# Patient Record
Sex: Female | Born: 1955 | Race: White | Hispanic: No | Marital: Married | Smoking: Former smoker
Health system: Southern US, Community
[De-identification: ages and names within clinical notes are randomized; demographics above are authoritative.]

## PROBLEM LIST (undated history)

## (undated) DIAGNOSIS — N179 Acute kidney failure, unspecified: Secondary | ICD-10-CM

## (undated) DIAGNOSIS — G4700 Insomnia, unspecified: Secondary | ICD-10-CM

## (undated) DIAGNOSIS — Z973 Presence of spectacles and contact lenses: Secondary | ICD-10-CM

## (undated) HISTORY — DX: Insomnia, unspecified: G47.00

## (undated) HISTORY — PX: BLADDER REPAIR: SHX76

## (undated) HISTORY — PX: KNEE SURGERY: SHX244

---

## 2007-10-05 HISTORY — PX: COLPOSCOPY: SHX161

## 2008-07-04 HISTORY — PX: CERVICAL BIOPSY  W/ LOOP ELECTRODE EXCISION: SUR135

## 2009-10-04 ENCOUNTER — Ambulatory Visit: Payer: Self-pay | Admitting: Gynecologic Oncology

## 2009-10-21 ENCOUNTER — Ambulatory Visit: Payer: Self-pay | Admitting: Gynecologic Oncology

## 2011-10-05 HISTORY — PX: ABDOMINAL HYSTERECTOMY: SHX81

## 2011-12-02 ENCOUNTER — Ambulatory Visit: Payer: Self-pay | Admitting: Obstetrics and Gynecology

## 2011-12-10 ENCOUNTER — Ambulatory Visit: Payer: Self-pay | Admitting: Obstetrics and Gynecology

## 2011-12-27 ENCOUNTER — Ambulatory Visit: Payer: Self-pay | Admitting: Gastroenterology

## 2011-12-29 LAB — PATHOLOGY REPORT

## 2012-04-17 ENCOUNTER — Ambulatory Visit: Payer: Self-pay | Admitting: Obstetrics and Gynecology

## 2012-04-17 LAB — CBC
HGB: 13.3 g/dL (ref 12.0–16.0)
MCH: 30 pg (ref 26.0–34.0)
MCHC: 33.7 g/dL (ref 32.0–36.0)
Platelet: 203 10*3/uL (ref 150–440)
RBC: 4.45 10*6/uL (ref 3.80–5.20)
WBC: 4 10*3/uL (ref 3.6–11.0)

## 2012-04-18 ENCOUNTER — Inpatient Hospital Stay: Payer: Self-pay | Admitting: Obstetrics and Gynecology

## 2012-04-19 LAB — HEMOGLOBIN: HGB: 11.2 g/dL — ABNORMAL LOW (ref 12.0–16.0)

## 2012-04-20 ENCOUNTER — Inpatient Hospital Stay: Payer: Self-pay | Admitting: Obstetrics and Gynecology

## 2012-04-20 LAB — COMPREHENSIVE METABOLIC PANEL
Albumin: 4 g/dL (ref 3.4–5.0)
Alkaline Phosphatase: 117 U/L (ref 50–136)
BUN: 24 mg/dL — ABNORMAL HIGH (ref 7–18)
Bilirubin,Total: 0.8 mg/dL (ref 0.2–1.0)
Calcium, Total: 9.6 mg/dL (ref 8.5–10.1)
Glucose: 134 mg/dL — ABNORMAL HIGH (ref 65–99)
Osmolality: 274 (ref 275–301)
SGOT(AST): 31 U/L (ref 15–37)
Sodium: 134 mmol/L — ABNORMAL LOW (ref 136–145)
Total Protein: 7.7 g/dL (ref 6.4–8.2)

## 2012-04-20 LAB — URINALYSIS, COMPLETE
Bacteria: NONE SEEN
Glucose,UR: NEGATIVE mg/dL (ref 0–75)
Leukocyte Esterase: NEGATIVE
Nitrite: NEGATIVE
Ph: 5 (ref 4.5–8.0)
Protein: NEGATIVE
RBC,UR: 8 /HPF (ref 0–5)

## 2012-04-20 LAB — BASIC METABOLIC PANEL
Anion Gap: 10 (ref 7–16)
BUN: 26 mg/dL — ABNORMAL HIGH (ref 7–18)
EGFR (African American): 14 — ABNORMAL LOW
EGFR (Non-African Amer.): 12 — ABNORMAL LOW
Glucose: 113 mg/dL — ABNORMAL HIGH (ref 65–99)
Osmolality: 279 (ref 275–301)

## 2012-04-20 LAB — CBC
HCT: 39.5 % (ref 35.0–47.0)
MCH: 29.2 pg (ref 26.0–34.0)
Platelet: 234 10*3/uL (ref 150–440)
RBC: 4.48 10*6/uL (ref 3.80–5.20)
WBC: 15.2 10*3/uL — ABNORMAL HIGH (ref 3.6–11.0)

## 2012-04-20 LAB — PATHOLOGY REPORT

## 2012-04-21 LAB — BASIC METABOLIC PANEL
BUN: 11 mg/dL (ref 7–18)
Calcium, Total: 8.5 mg/dL (ref 8.5–10.1)
Co2: 30 mmol/L (ref 21–32)
EGFR (African American): 53 — ABNORMAL LOW
Glucose: 136 mg/dL — ABNORMAL HIGH (ref 65–99)
Osmolality: 283 (ref 275–301)
Potassium: 4.3 mmol/L (ref 3.5–5.1)
Sodium: 141 mmol/L (ref 136–145)

## 2012-04-21 LAB — CBC WITH DIFFERENTIAL/PLATELET
HCT: 32 % — ABNORMAL LOW (ref 35.0–47.0)
HGB: 10.8 g/dL — ABNORMAL LOW (ref 12.0–16.0)
Lymphocytes: 13 %
MCH: 29.8 pg (ref 26.0–34.0)
MCV: 89 fL (ref 80–100)
RDW: 12.9 % (ref 11.5–14.5)
Segmented Neutrophils: 73 %
WBC: 8.6 10*3/uL (ref 3.6–11.0)

## 2012-04-22 LAB — BASIC METABOLIC PANEL
Anion Gap: 7 (ref 7–16)
Calcium, Total: 9 mg/dL (ref 8.5–10.1)
Chloride: 101 mmol/L (ref 98–107)
Co2: 31 mmol/L (ref 21–32)
EGFR (Non-African Amer.): >60
Glucose: 130 mg/dL — ABNORMAL HIGH (ref 65–99)
Osmolality: 276 (ref 275–301)

## 2012-04-22 LAB — MAGNESIUM: Magnesium: 1.8 mg/dL

## 2012-04-23 ENCOUNTER — Ambulatory Visit: Payer: Self-pay | Admitting: Urology

## 2012-04-23 LAB — BASIC METABOLIC PANEL
Calcium, Total: 8.8 mg/dL (ref 8.5–10.1)
Co2: 31 mmol/L (ref 21–32)
Creatinine: 0.54 mg/dL — ABNORMAL LOW (ref 0.60–1.30)
EGFR (African American): >60
EGFR (Non-African Amer.): >60
Glucose: 106 mg/dL — ABNORMAL HIGH (ref 65–99)
Osmolality: 276 (ref 275–301)
Sodium: 140 mmol/L (ref 136–145)

## 2012-04-23 LAB — MAGNESIUM: Magnesium: 1.9 mg/dL

## 2012-04-26 ENCOUNTER — Inpatient Hospital Stay: Payer: Self-pay | Admitting: Obstetrics and Gynecology

## 2012-04-26 LAB — CBC WITH DIFFERENTIAL/PLATELET
Basophil #: 0.1 10*3/uL (ref 0.0–0.1)
Eosinophil #: 0.4 10*3/uL (ref 0.0–0.7)
Eosinophil %: 4 %
HCT: 38.3 % (ref 35.0–47.0)
HGB: 12.5 g/dL (ref 12.0–16.0)
Lymphocyte #: 1.4 10*3/uL (ref 1.0–3.6)
MCH: 28.9 pg (ref 26.0–34.0)
MCHC: 32.7 g/dL (ref 32.0–36.0)
MCV: 89 fL (ref 80–100)
Monocyte #: 1.3 x10 3/mm — ABNORMAL HIGH (ref 0.2–0.9)
Neutrophil %: 66.6 %
RDW: 12.7 % (ref 11.5–14.5)

## 2012-04-26 LAB — URINALYSIS, COMPLETE
Bilirubin,UR: NEGATIVE
Glucose,UR: NEGATIVE mg/dL (ref 0–75)
Ph: 9 (ref 4.5–8.0)
Protein: 100
RBC,UR: 1970 /HPF (ref 0–5)
Specific Gravity: 1.018 (ref 1.003–1.030)
Squamous Epithelial: NONE SEEN

## 2012-04-26 LAB — COMPREHENSIVE METABOLIC PANEL
Anion Gap: 10 (ref 7–16)
Bilirubin,Total: 0.6 mg/dL (ref 0.2–1.0)
Calcium, Total: 9.3 mg/dL (ref 8.5–10.1)
Chloride: 103 mmol/L (ref 98–107)
Co2: 26 mmol/L (ref 21–32)
Creatinine: 0.92 mg/dL (ref 0.60–1.30)
EGFR (African American): >60
EGFR (Non-African Amer.): >60
Glucose: 117 mg/dL — ABNORMAL HIGH (ref 65–99)
Potassium: 3.5 mmol/L (ref 3.5–5.1)
SGOT(AST): 41 U/L — ABNORMAL HIGH (ref 15–37)
SGPT (ALT): 32 U/L
Sodium: 139 mmol/L (ref 136–145)

## 2015-01-21 NOTE — Op Note (Signed)
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   PROCEDURE PERFORMED: Laparoscopic-assisted vaginal hysterectomy and bilateral salpingectomy.   PRIMARY SURGEON: Stoney Bang. Georgianne Fick, MD   ASSISTANT: Donzetta Matters, MD   ESTIMATED BLOOD LOSS: 100 mL.  OPERATIVE FLUIDS: 900 mL.   COMPLICATIONS: None.   INDICATION FOR PROCEDURE: The patient is a 59 year old female who has been followed for the past five years for persistent abnormal Pap as well as a persistent human papilloma virus infection. The patient was previously treated with a cold knife cone which showed no evident dysplasia; however, her Paps continue to be abnormal, and colposcopies are inadequate secondary to cervical stenosis. After discussion of management options, including repeat excisional procedure with hysterectomy, the patient decides to elect for hysterectomy.   FINDINGS: Normal tubes, ovaries, uterus and cervix. The remainder of intra-abdominal anatomy was grossly normal.   SPECIMENS REMOVED: Uterus and cervix, right and left tubes.   DRAINS: Foley to gravity drainage.   IMPLANTS: None.  CONDITION FOLLOWING THE PROCEDURE: Stable.   PROCEDURE IN DETAIL: The risks, benefits, and alternatives of the procedure were discussed with the patient prior to proceeding to the Operating Room. The patient was taken to the Operating Room and placed under general endotracheal anesthesia and positioned in the dorsal lithotomy position. She was then prepped and draped in the usual sterile fashion. A Foley catheter was placed into the patient's bladder. Next, a sterile speculum was placed. The patient's cervix was visualized and noted to be almost flush with the anterior vaginal wall. The  cervix was grasped with two single-tooth tenacula anteriorly and posteriorly, and the cervical os was dilated using Pratt dilators. Following dilation of the cervix, the uterus was noted to sound to 6 cm. A VCare device was placed. After placement of the VCare device, the single-tooth tenaculum was removed, as was the sterile speculum. Attention was then turned to the patient's abdomen. The umbilicus was infiltrated with lidocaine, and a small 5 mm incision was made at the base of the umbilicus. An XL port was then used to gain entry into the peritoneum under direct visualization. Once intraperitoneal entry had been confirmed, insufflation was begun. Two lateral assistant ports were placed under direct visualization. These were 5 mm ports as well. Following placement of the assistant ports, general inspection of the abdomen and pelvis was conducted noting normal findings. However, on manipulating the uterus, it was noted that the [facility] had entered the left portion of the broad ligament. Given that the [facility] was not in proper position, decision was made to proceed with LAVH. The fallopian tubes were dissected off the mesosalpinx. Using a 5 mm Harmonic scalpel, the round ligaments and utero-ovarian ligaments were then cauterized using bipolar cautery transecting using the Harmonic scalpel. The anterior leaf of the broad ligament was then dissected on the right down to the level of the internal cervical os. A bladder flap was created, and the anterior leaf of the broad ligament on the left was dissected in a similar fashion. The uterine vessels were skeletonized and then cauterized using bipolar energy before being transected using Harmonic scalpel. At this point, given that without the VCare cuff in the proper position, the vaginal fornices could not be clearly visualized, and the decision was made to move below for the remainder of  the procedure. A short weighted speculum was placed. The VCare device had been  removed, and a single-tooth tenaculum was placed on the anterior and posterior lip of the cervix. The cervix was then injected with 1% lidocaine, and  a circumferential incision was made around the base of the cervix using a number 10 scalpel. Following this, entry into the posterior cul-de-sac was accomplished using Metzenbaum Mayo scissors. After confirming intraperitoneal entry posteriorly, the short weighted speculum was changed out for a long weighted speculum. Some difficulty was then encountered in attempting to enter the anterior peritoneum; however, this was eventually successful, and the remainder of the hysterectomy was undertaken by taking serial bites medial to prior pedicles using Heaney clamps suture ligating the pedicles using Heaney stitches of 0 Vicryl. Once the uterus had been freed of all its pedicles, it was removed vaginally. The vaginal cuff was then closed using figure-of-eight stitches of 0 Vicryl. Following closure of the cuff, bimanual examination revealed no defects in the cuff, and attention was once again turned to the abdominal wall. A quick laparoscopy was performed to take a look and visualize the vaginal cuff. This was noted to be hemostatic. There was some bleeding from a small pedicle on the patient's right which was cauterized with bipolar cautery. One gram of Arista powder was applied to the vaginal cuff and pedicles.  The later assistant trochars were then removed under direct visualization, and pneumoperitoneum evacuted.  Each trochar sites were dressed with derma bond.  Sponge needle and instrument counts were correct times two and the patient was taken to the recovery room in stable condition.  ____________________________ Stoney Bang. Georgianne Fick, MD ams:cbb D: 04/18/2012 16:39:56 ET T: 04/18/2012 17:14:33 ET JOB#: [id number]  cc: Stoney Bang. Georgianne Fick, MD, <Dictator> Conan Bowens Madelon Lips MD ELECTRONICALLY SIGNED 04/19/2012 7:34

## 2015-01-21 NOTE — Consult Note (Signed)
Pt doing well this AM.discomfortUOP, Clear.N/V.start clear liquid dietfor help stimulate bowel function  Electronic Signatures: Murrell Redden (MD)  (Signed on 19-Jul-13 07:18)  Authored  Last Updated: 19-Jul-13 07:18 by Murrell Redden (MD)

## 2015-01-21 NOTE — Consult Note (Signed)
Chief Complaint:   Subjective/Chief Complaint Urology POD #2 Pt without new complaints (persistent nausea). No flatus. Good pain control with PCA. Has been ambulating in the room.   VITAL SIGNS/ANCILLARY NOTES: **Vital Signs.:   20-Jul-13 12:05   Vital Signs Type Routine   Temperature Temperature (F) 97.4   Celsius 36.3   Temperature Source Oral   Pulse Pulse 86   Respirations Respirations 20   Systolic BP Systolic BP 992   Diastolic BP (mmHg) Diastolic BP (mmHg) 88   Mean BP 107   Pulse Ox % Pulse Ox % 93   Pulse Ox Activity Level  At rest   Oxygen Delivery Room Air/ 21 %  *Intake and Output.:   Shift 20-Jul-13 07:00   Grand Totals Intake:  960 Output:  1800    Net:  -840 75 Hr.:  -2526.46   IV (Primary)      In:  960   Urine ml     Out:  1800   Length of Stay Totals Intake:  3693.54 Output:  7490    Net:  -3796.46    Daily 07:00   Grand Totals Intake:  2023.54 Output:  4268    Net:  -2526.46 24 Hr.:  -2526.46   Oral Intake      In:  0   IV (Primary)      In:  2023.54   Urine ml     Out:  4550   Length of Stay Totals Intake:  3693.54 Output:  7490    Net:  -3796.46    07:45   Grand Totals Intake:  120 Output:      Net:  120 24 Hr.:  120   Oral Intake      In:  120    08:20   Grand Totals Intake:  100.11 Output:  450    Net:  -349.89 24 Hr.:  -229.89   IV (Primary)      In:  100.11   Urine ml     Out:  450   Urinary Method  Foley    Shift 15:00   Grand Totals Intake:  220.11 Output:  450    Net:  -229.89 24 Hr.:  -229.89   Oral Intake      In:  120   IV (Primary)      In:  100.11   Urine ml     Out:  450   Length of Stay Totals Intake:  3913.65 Output:  7940    Net:  -4026.35   Brief Assessment:   Additional Physical Exam WDWN WF in NAD, appearing tired  Abd - Soft/flat, Wound - Dsg CDI Foley in place draining clear urine   Lab Results: Routine Chem:  19-Jul-13 06:13    Glucose, Serum  136   BUN 11   Creatinine (comp)  1.32    Sodium, Serum 141   Potassium, Serum 4.3   Chloride, Serum 104   CO2, Serum 30   Calcium (Total), Serum 8.5   Anion Gap 7   Osmolality (calc) 283   eGFR (African American)  53   eGFR (Non-African American)  45 (eGFR values <76m/min/1.73 m2 may be an indication of chronic kidney disease (CKD). Calculated eGFR is useful in patients with stable renal function. The eGFR calculation will not be reliable in acutely ill patients when serum creatinine is changing rapidly. It is not useful in  patients on dialysis. The eGFR calculation may not be applicable to patients at the low and  high extremes of body sizes, pregnant women, and vegetarians.)   Magnesium, Serum 1.8 (1.8-2.4 THERAPEUTIC RANGE: 4-7 mg/dL TOXIC: > 10 mg/dL  -----------------------)  20-Jul-13 06:45    Glucose, Serum  130   BUN  3   Creatinine (comp)  0.58   Sodium, Serum 139   Potassium, Serum 3.7   Chloride, Serum 101   CO2, Serum 31   Calcium (Total), Serum 9.0   Anion Gap 7   Osmolality (calc) 276   eGFR (African American) >60   eGFR (Non-African American) >60 (eGFR values <30m/min/1.73 m2 may be an indication of chronic kidney disease (CKD). Calculated eGFR is useful in patients with stable renal function. The eGFR calculation will not be reliable in acutely ill patients when serum creatinine is changing rapidly. It is not useful in  patients on dialysis. The eGFR calculation may not be applicable to patients at the low and high extremes of body sizes, pregnant women, and vegetarians.)   Magnesium, Serum 1.8 (1.8-2.4 THERAPEUTIC RANGE: 4-7 mg/dL TOXIC: > 10 mg/dL  -----------------------)  Routine Hem:  19-Jul-13 06:13    WBC (CBC) 8.6   RBC (CBC)  3.61   Hemoglobin (CBC)  10.8   Hematocrit (CBC)  32.0   Platelet Count (CBC) 189 (Result(s) reported on 21 Apr 2012 at 07:44AM.)   MCV 89   MCH 29.8   MCHC 33.6   RDW 12.9   Bands 1   Segmented Neutrophils 73   Lymphocytes 13   Monocytes 12    Eosinophil 1   Diff Comment 1 HYPOCHROMIA   Diff Comment 2 NORMAL PLT MORPHOLGY  Result(s) reported on 21 Apr 2012 at 07:44AM.   Manual Diff MANUAL DIFF DONE  Result(s) reported on 21 Apr 2012 at 07:46AM.   Assessment/Plan:  Invasive Device Daily Assessment of Necessity:   Does the patient currently have any of the following indwelling devices? foley    Indwelling Urinary Catheter continued, requirement due to    Reason to continue Indwelling Urinary Catheter long term catheterization has already been initiated   Assessment/Plan:   Assessment s/p Transvesical Repair of Cystotomy -continues stable post-op -awaiting return of bowel function    Plan 1. Increase ambulation 2. Continue foley catheter with appropriate antibiotic coverage  Will continue to follow with you.   Electronic Signatures: KDarcella Cheshire(MD)  (Signed 20-Jul-13 12:54)  Authored: Chief Complaint, VITAL SIGNS/ANCILLARY NOTES, Brief Assessment, Lab Results, Assessment/Plan   Last Updated: 20-Jul-13 12:54 by KDarcella Cheshire(MD)

## 2015-01-21 NOTE — Consult Note (Signed)
Pt doing wellGood UOP, ClearC,D,IPO'sBMAbd distention@7  days for foley and Staple removal  Electronic Signatures: Murrell Redden (MD)  (Signed on 22-Jul-13 07:57)  Authored  Last Updated: 22-Jul-13 07:57 by Murrell Redden (MD)

## 2015-01-21 NOTE — Consult Note (Signed)
Chief Complaint:   Subjective/Chief Complaint Urology POD #3 Pt without new complaints. Ambulating well, however, still no flatus. No N/V.  Tolerating clear liquids well, however, would like to try a regular diet.   VITAL SIGNS/ANCILLARY NOTES: **Vital Signs.:   21-Jul-13 13:00   Vital Signs Type Routine   Temperature Temperature (F) 97.9   Celsius 36.6   Temperature Source Oral   Pulse Pulse 92   Systolic BP Systolic BP 213   Diastolic BP (mmHg) Diastolic BP (mmHg) 91   Mean BP 110   Pulse Ox % Pulse Ox % 95   Oxygen Delivery Room Air/ 21 %  *Intake and Output.:   Daily 21-Jul-13 07:00   Grand Totals Intake:  1995.22 Output:  2500    Net:  -504.78 24 Hr.:  -504.78   Oral Intake      In:  480   IV (Primary)      In:  1515.22   Urine ml     Out:  2500   Length of Stay Totals Intake:  5688.76 Output:  9990    Net:  -4301.24    Shift 15:00   Grand Totals Intake:  730.32 Output:  1450    Net:  -719.68 24 Hr.:  -719.68   Oral Intake      In:  180   IV (Primary)      In:  550.32   Urine ml     Out:  1450   Length of Stay Totals Intake:  6419.08 Output:  11440    Net:  -5020.92   Brief Assessment:   Additional Physical Exam WDWN WF in NAD  Abd - Soft/flat, Wound - CDI Foley in place draining clear urine   Lab Results: Routine Chem:  21-Jul-13 07:16    Glucose, Serum  106   BUN  3   Creatinine (comp)  0.54   Sodium, Serum 140   Potassium, Serum 3.5   Chloride, Serum 100   CO2, Serum 31   Calcium (Total), Serum 8.8   Anion Gap 9   Osmolality (calc) 276   eGFR (African American) >60   eGFR (Non-African American) >60 (eGFR values <74m/min/1.73 m2 may be an indication of chronic kidney disease (CKD). Calculated eGFR is useful in patients with stable renal function. The eGFR calculation will not be reliable in acutely ill patients when serum creatinine is changing rapidly. It is not useful in  patients on dialysis. The eGFR calculation may not be  applicable to patients at the low and high extremes of body sizes, pregnant women, and vegetarians.)   Magnesium, Serum 1.9 (1.8-2.4 THERAPEUTIC RANGE: 4-7 mg/dL TOXIC: > 10 mg/dL  -----------------------)   Assessment/Plan:  Invasive Device Daily Assessment of Necessity:   Does the patient currently have any of the following indwelling devices? foley    Indwelling Urinary Catheter continued, requirement due to    Reason to continue Indwelling Urinary Catheter long term catheterization has already been initiated   Assessment/Plan:   Assessment s/p Transvesical Repair of Cystotomy -continues stable post-op; ambulating well -still awaiting full return of bowel function, however, pt is developing an appetite and would like to try a regular diet.  -advised pt to monitor closely for bloating or nausea on the regular diet    Plan 1. Continue ambulation 2. Continue foley catheter with appropriate antibiotic coverage 3. Regular diet, as tolerated  Dr. CJacqlyn Larsenwill resume care tomorrow morning.   Electronic Signatures: KDarcella Cheshire(MD)  (Signed 21-Jul-13 13:58)  Authored: Chief Complaint, VITAL SIGNS/ANCILLARY NOTES, Brief Assessment, Lab Results, Assessment/Plan   Last Updated: 21-Jul-13 13:58 by Darcella Cheshire (MD)

## 2015-01-21 NOTE — Op Note (Signed)
PATIENT NAME:  Aimee Stuart, Aimee Stuart MR#:  [id number] DATE OF BIRTH:  03/29/1956  DATE OF PROCEDURE:  04/20/2012  PREOPERATIVE DIAGNOSIS: Bladder perforation.   POSTOPERATIVE DIAGNOSIS: Bladder perforation.   PROCEDURES:  1. Cystorrhaphy.  2. Cystoscopy.  3. Bilateral retrograde pyelogram.   SURGEON: Edrick Oh, M.D.   ASSISTANT: [name], M.D.   ANESTHESIA: General endotracheal anesthesia.   INDICATIONS: The patient is a 59 year old white female who underwent recent hysterectomy. She presented to the Emergency Room on 04/20/2012 with abdominal pain, distention, nausea, and vomiting. An ultrasound was obtained demonstrating significant intra-abdominal fluid. There was concern of the possibility of bladder perforation. She underwent a CT cystogram demonstrating significant contrast within the abdominal compartment consistent with bladder perforation. She presents for further evaluation and bladder repair.   DESCRIPTION OF PROCEDURE: After informed consent was obtained, the patient was taken to the Operating Room and placed in the dorsal lithotomy position under general endotracheal anesthesia. The patient was then prepped and draped in the usual standard fashion. The #22 [place] rigid cystoscope was introduced into the urethra under direct vision with no urethral abnormalities noted. Upon entering the bladder, the mucosa was inspected in its entirety. Complete expansion of the bladder was not possible due to the perforation. The perforation was identified on the posterior bladder base just medial to the left ureteral orifice. The extent of the perforation was approximately 2.5 cm. The ureteral orifices were otherwise normal. No other abnormalities were appreciated. An 8 [place] open-ended catheter was introduced into the right ureteral orifice. A retrograde pyelogram was performed demonstrating no evidence of ureteral injury. Normal contrast caliber and columnization was noted. An attempt at  retrograde through the left ureteral orifice was unsuccessful. A guidewire was placed into the left ureteral orifice and advanced into the upper collecting system under fluoroscopic guidance without difficulty. A #6 [place] open-ended catheter was inserted over the guidewire. A retrograde pyelogram was performed with gradual withdrawal of the open-ended catheter demonstrating no evidence of contrast extravasation with normal caliber and columnization throughout the entire ureter. Rapid drainage was noted after removal of the open-ended catheter. The cystoscope was then removed. A #22 French Foley catheter was placed to gravity drainage. The patient was repositioned into the supine position. She was then reprepped and draped in the usual standard fashion. She had had a previous cesarean section. The decision was made to proceed through the cesarean section scar. The incision was continued approximately 2 cm more lateral in both directions. Significant scar was encountered during the incision. The tissue was noted to be very edematous with large amounts of fluid escaping making cauterization more difficult. The fascia was subsequently identified. Normal planes were identified both above and below the scar. The overlying tissue was dissected free utilizing electrocautery to the level of the scar. The midline was identified. The overlying skin was freed from the fascia, approximately 10 cm superior and 54 cm inferior. The midline was opened. The peritoneum was entered with a large amount of fluid drained. The remainder of the peritoneum was opened. The superior vesicle space was developed. The bladder was identified. The Bookwalter retractor was then placed. The anterior aspect of the bladder was visualized. Allis clamps were placed. The bladder was then opened in standard fashion. The bladder was opened to the dome of the bladder. It was then opened approaching the symphysis. Several areas of bleeding were identified.  These were controlled with electrocautery. Visualization of the posterior aspect of the bladder demonstrated an approximately 2.5 cm perforation  in the bladder base just medial to the left ureteral orifice. There was a reasonable distance, however, between the orifice and the edge of the muscular tear the muscle was noted to be thin on the more left lateral side. The right aspect was full-thickness. This indicated a tangential trauma. The Bookwalter blades were placed in the bladder utilizing sponges to help with visualization utilizing a 2-0 Vicryl suture on a horseshoe needle. The perforation was first closed in a single layer. Allis clamps were utilized to help identify the muscularis. Full thickness bites were obtained through the muscle and initial mucosa. Once the perforation was closed, a second 2-0 Vicryl suture was then utilized with more superficial muscle and mucosal edge reapproximation resulting in a two layer closure. The medial aspect was very close to the ureteral orifice, but was still an adequate distance. Good urine jets were noted bilaterally. The anterior bladder wall was then closed in a two layer fashion utilizing 2-0 Vicryl suture. The midline incision site after removal of the Bookwalter retractor and lap pads was closed. This was closed utilizing 0 and 1 Prolene sutures in a running fashion. Three separate sutures were utilized for the closure. The superior aspect of the incision demonstrated moderately thinned fascia. Wide bites were taken for adequate tissue support. The subcutaneous tissue was tacked to the fascia in four points both superior and inferior. The skin was then closed utilizing staples. A Telfa and Tegaderm dressing was applied. The patient was then awakened from general endotracheal anesthesia. She was taken to the recovery room in stable condition. There were no problems or complications. The patient tolerated the procedure well. Estimated blood loss was minimal.    ____________________________ Denice Bors. Jacqlyn Larsen, MD bsc:ap D: 04/21/2012 17:55:39 ET T: 04/22/2012 09:44:26 ET JOB#: [id number]  cc: Denice Bors. Jacqlyn Larsen, MD, <Dictator> Stoney Bang. Georgianne Fick, MD Denice Bors Amber Guthridge MD ELECTRONICALLY SIGNED 04/25/2012 7:16

## 2015-01-21 NOTE — Consult Note (Signed)
Pt seen, Chart Reviewed, Films Reviewed, Note Dictated  Bladder perforation  Pt willneed emergent cystorraphy to repair the bladder.  We will plan on Cystoscopy with bilateral retrogrades prior to evaluate the ureters and determine the exact location of the perforation.  Discussed with patient and family including risks and benefits. Pt agrees to proceed.  Electronic Signatures: Murrell Redden (MD)  (Signed on 18-Jul-13 17:36)  Authored  Last Updated: 18-Jul-13 17:36 by Murrell Redden (MD)

## 2015-02-11 NOTE — H&P (Signed)
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  She was discharged yesterday from [facility] from the care of Dr. [name] after she had met all discharge goals. She states that yesterday evening at about 6pm she began having nausea and vomiting and from that point was unable to hold anything by mouth.  This includes her pain medication.  The nausea and vomiting persisted throughout the night. So, she contacted Dr. Georgianne Fick this morning who asked her to be evaluated in the emergency room.  She presents with the additional history of voiding only once since arriving home yesterday.  The ED attending physician informed me that she had 250cc in her bladder in the emergency room.  She denies fevers, but has had shaking and chills.  She denies chest pain and trouble breathing.  She has not yet passed flatus postoperatively.    Patient's Medical History No Chronic Illness    Patient's Surgical History Previous C-Section  laparoscopic assisted vaginal hysterectomy, as above.  She has also had knee surgery at age 23.    Medications trazadone 164m qhs for insomnia    Allergies NKDA    Social History none    Family History Non-Contributory   ROS:   ROS All systems were reviewed.  HEENT, CNS, GI, GU, Respiratory, CV, Renal and Musculoskeletal systems were found to be normal., unless noted in HPI   Exam:   Vital Signs stable  afebrile    General moderate distress, she speaks very softly    Mental Status clear    Chest clear  only shallow breaths taken    Heart normal sinus rhythm    Abdomen soft, moderate tenderness to palpation, no rebound or guarding. scant-to-absent bowel sounds. incision sites are all clean/dry/intact    Back no CVAT    Edema no edema    Skin dry    Other  Comprehensive metabolic panel review: Creatinine is 3.7, eFGR is <20.  Glucose is 134, lipase normal, LFTs wnl, anion gap wnl, K+ 4.2,  Radiology: 3-way abdominal x-ray shows findings consistent with recent post-op status with additional findings suggestive of ileus.   CBC WBC=15.2, hgb/hct = 13.1/39.5, platelets=234   Impression:   Impression 1) Acute renal failure secondary to post-operative ileus and secondary nausea and vomiting   Plan:   Comments 1) admit 2) acute renal failure: rehydrate with IV fluids, will give 2L (already received 1 L) over next 4 hours, then give maintenance. recheck creatinine this evening 3) ileus: NPO, discussed possible need for NG tube if nausea/emesis not controlled with IV antiemetics. Will primarly attempt to control with phenergan and give IV zofran if phenergan isn't helping.  Phenergan is primary choice as zofran can lead to slowing of bowels. 4) nausea/emesis: iv antiemetics as above. 5) Post-operative pain: due to NPO status, will give dilaudid 141mq4 hours IV prn pain 6) ultrasound abdomen ordered by ED attending. Will await results of that scan to change any of above management. 7) Dr. StGeorgianne Fickware patient is readmitted and will see her this evening. i will defer to him on overall management, if he prefers.   Electronic Signatures: JaWill BonnetMD)  (Signed 18-Jul-13 12:47)  Authored: L&D Evaluation   Last Updated: 18-Jul-13 12:47 by JaWill BonnetMD)

## 2015-02-11 NOTE — H&P (Signed)
L&D Evaluation:  History:   HPI 59yo female who is  post-op day # 9 s/p laparoscopic assisted vaginal hysterectomy for persistent cervical dysplasia complicated by unrecognized intraoperative cystotomy, now POD#7 s/p cystoscopy, bilateral retrograde pylograms, and cystohrraphy.  The patient presents with 1 day history of po intolerance and LUQ abdominal pain.  She states her po tolerance had been fair although she still did not have much of an appetite.  She reports + flatus, no BM at home.  The patient denies vaginal discharge or bleeding.  No fevers, chills.    Presents with abdominal pain, nausea/vomiting    Patient's Medical History No Chronic Illness    Patient's Surgical History Previous C-Section  laparoscopic assisted vaginal hysterectomy, as above.  Cystorrhaphy. She has also had knee surgery at age 14.    Medications Motrin (Ibuprofen)  Percocet 5/325 1 tab po q4hrs prn pain, Zanax 0.80m po bid prn anxiety    Allergies NKDA    Social History none    Family History Non-Contributory   ROS:   ROS All systems were reviewed.  HEENT, CNS, GI, GU, Respiratory, CV, Renal and Musculoskeletal systems were found to be normal., unless noted in HPI   Exam:   Vital Signs BP >140/90  afebrile    Urine Protein See UA    General no apparent distress    Mental Status clear    Chest clear  only shallow breaths taken    Heart normal sinus rhythm    Abdomen soft, moderate tenderness to palpation, no rebound or guarding. scant-to-absent bowel sounds. incision sites are all clean/dry/intact, intact staple line    Back no CVAT    Edema no edema    Skin dry    Other Labs reviewed Cr normal, WBC non elevated, CT abdomen and pelvis showing no extravesation of contrast from the urinary bladder, moderate amount of stool in colon.   Impression:   Impression Nausea, vomitting, dehyrdation   Plan:   Comments 1) Admit 2) Nausea/Emesis - con't IVF and IV antiemetics, will re-evaluate  response and advance diet as tolerated     - Famotidine for GI ppx 3) Cystorrhaphy - con't foley, con't cipro will switch to IV while not tolerating po 4) Anxiety - prn ativan written   Electronic Signatures: SDorthula Nettles(MD)  (Signed 25-Jul-13 07:48)  Authored: L&D Evaluation   Last Updated: 25-Jul-13 07:48 by SDorthula Nettles(MD)

## 2015-08-04 ENCOUNTER — Other Ambulatory Visit: Payer: Self-pay | Admitting: Physician Assistant

## 2015-08-06 ENCOUNTER — Other Ambulatory Visit: Payer: Self-pay | Admitting: Emergency Medicine

## 2015-08-06 DIAGNOSIS — G4700 Insomnia, unspecified: Secondary | ICD-10-CM

## 2015-08-06 MED ORDER — TRAZODONE HCL 100 MG PO TABS: 100.0000 mg | ORAL_TABLET | Freq: Every day | ORAL | Status: DC

## 2015-08-06 NOTE — Telephone Encounter (Signed)
Received a faxed medication request from [facility] in [place].  The script request says Trazodone 182m 1 daily at bed time,when I enter 1058min the system it automatically says take 2 pills at night.  It won't let me change how many pills the patient should take. Please advise.  Thank you.

## 2015-09-03 ENCOUNTER — Ambulatory Visit: Payer: Self-pay | Admitting: Physician Assistant

## 2015-09-03 VITALS — BP 100/70 | HR 64 | Temp 98.0°F

## 2015-09-03 DIAGNOSIS — Z299 Encounter for prophylactic measures, unspecified: Secondary | ICD-10-CM

## 2015-09-03 DIAGNOSIS — J3089 Other allergic rhinitis: Secondary | ICD-10-CM

## 2015-09-03 MED ORDER — BUPROPION HCL ER (SR) 150 MG PO TB12
ORAL_TABLET | ORAL | Status: DC
Start: 2015-09-03 — End: 2015-12-22

## 2015-09-03 MED ORDER — FLUTICASONE PROPIONATE 50 MCG/ACT NA SUSP: 2.0000 | Freq: Every day | NASAL | Status: DC

## 2015-09-03 NOTE — Progress Notes (Signed)
S: here for yearly labs, no problems, would like to quit smoking, smokes 6 cigarettes qd, husband also smokes and smokes in the house, also out of flonase, ros neg  O: vitals wnl, nad, lungs c t a, cv rrr  A: smoking cessation, allergic rhinitis  P: flonase, wellbutrin sr 154m 1 po qd for 2 weeks then can increase to 1 bid

## 2015-09-04 LAB — CMP12+LP+TP+TSH+6AC+CBC/D/PLT
A/G RATIO: 2.4 (ref 1.1–2.5)
ALK PHOS: 92 IU/L (ref 39–117)
ALT: 19 IU/L (ref 0–32)
AST: 30 IU/L (ref 0–40)
Albumin: 4.5 g/dL (ref 3.5–5.5)
BASOS: 2 %
BUN/Creatinine Ratio: 11 (ref 9–23)
BUN: 10 mg/dL (ref 6–24)
Basophils Absolute: 0.1 10*3/uL (ref 0.0–0.2)
Bilirubin Total: 0.4 mg/dL (ref 0.0–1.2)
CALCIUM: 9.5 mg/dL (ref 8.7–10.2)
CHOL/HDL RATIO: 3.9 ratio (ref 0.0–4.4)
Chloride: 107 mmol/L — ABNORMAL HIGH (ref 97–106)
Cholesterol, Total: 179 mg/dL (ref 100–199)
Creatinine, Ser: 0.88 mg/dL (ref 0.57–1.00)
EOS (ABSOLUTE): 0.4 10*3/uL (ref 0.0–0.4)
Eos: 9 %
Estimated CHD Risk: 0.8 times avg. (ref 0.0–1.0)
Free Thyroxine Index: 2.7 (ref 1.2–4.9)
GFR calc non Af Amer: 72 mL/min/{1.73_m2} (ref >59)
GFR, EST AFRICAN AMERICAN: 83 mL/min/{1.73_m2} (ref >59)
GGT: 19 IU/L (ref 0–60)
GLOBULIN, TOTAL: 1.9 g/dL (ref 1.5–4.5)
Glucose: 89 mg/dL (ref 65–99)
HDL: 46 mg/dL (ref >39)
HEMATOCRIT: 35.5 % (ref 34.0–46.6)
Hemoglobin: 11.6 g/dL (ref 11.1–15.9)
IMMATURE GRANS (ABS): 0 10*3/uL (ref 0.0–0.1)
Immature Granulocytes: 0 %
Iron: 55 ug/dL (ref 27–159)
LDH: 167 IU/L (ref 119–226)
LDL CALC: 105 mg/dL — AB (ref 0–99)
LYMPHS: 32 %
Lymphocytes Absolute: 1.4 10*3/uL (ref 0.7–3.1)
MCH: 29.5 pg (ref 26.6–33.0)
MCHC: 32.7 g/dL (ref 31.5–35.7)
MCV: 90 fL (ref 79–97)
MONOCYTES: 12 %
Monocytes Absolute: 0.5 10*3/uL (ref 0.1–0.9)
NEUTROS ABS: 2 10*3/uL (ref 1.4–7.0)
Neutrophils: 45 %
POTASSIUM: 5.3 mmol/L — AB (ref 3.5–5.2)
Phosphorus: 3.2 mg/dL (ref 2.5–4.5)
Platelets: 273 10*3/uL (ref 150–379)
RBC: 3.93 x10E6/uL (ref 3.77–5.28)
RDW: 13 % (ref 12.3–15.4)
Sodium: 145 mmol/L — ABNORMAL HIGH (ref 136–144)
T3 Uptake Ratio: 28 % (ref 24–39)
T4, Total: 9.6 ug/dL (ref 4.5–12.0)
TRIGLYCERIDES: 142 mg/dL (ref 0–149)
TSH: 1.33 u[IU]/mL (ref 0.450–4.500)
Total Protein: 6.4 g/dL (ref 6.0–8.5)
Uric Acid: 4.4 mg/dL (ref 2.5–7.1)
VLDL Cholesterol Cal: 28 mg/dL (ref 5–40)
WBC: 4.4 10*3/uL (ref 3.4–10.8)

## 2015-09-04 LAB — VITAMIN D 25 HYDROXY (VIT D DEFICIENCY, FRACTURES): Vit D, 25-Hydroxy: 37.9 ng/mL (ref 30.0–100.0)

## 2015-09-04 NOTE — Progress Notes (Signed)
I left a message on patient's voicemail to call me back regarding her lab results.

## 2015-09-09 ENCOUNTER — Other Ambulatory Visit: Payer: Self-pay

## 2015-09-09 DIAGNOSIS — Z299 Encounter for prophylactic measures, unspecified: Secondary | ICD-10-CM

## 2015-09-09 NOTE — Progress Notes (Signed)
I received a call back from the patient.  I went over her lab results with her and she expressed understanding.  Pt is coming back today to recheck her Potassium, Sodium and Chloride levels.

## 2015-09-09 NOTE — Progress Notes (Signed)
Patient came in to have blood drawn per [facility] authorization. Aimee Stuart wants to recheck the patient's Sodium, Potassium and Chloride level. Blood was drawn from left arm without any incident.

## 2015-09-09 NOTE — Progress Notes (Signed)
Attempted to contact patient by phone without any success.  Left a message.

## 2015-09-10 LAB — BASIC METABOLIC PANEL
BUN / CREAT RATIO: 14 (ref 9–23)
BUN: 12 mg/dL (ref 6–24)
CALCIUM: 9.6 mg/dL (ref 8.7–10.2)
CO2: 26 mmol/L (ref 18–29)
Chloride: 103 mmol/L (ref 97–106)
Creatinine, Ser: 0.86 mg/dL (ref 0.57–1.00)
GFR, EST AFRICAN AMERICAN: 86 mL/min/{1.73_m2} (ref >59)
GFR, EST NON AFRICAN AMERICAN: 74 mL/min/{1.73_m2} (ref >59)
Glucose: 88 mg/dL (ref 65–99)
POTASSIUM: 4.6 mmol/L (ref 3.5–5.2)
Sodium: 143 mmol/L (ref 136–144)

## 2015-09-10 NOTE — Progress Notes (Signed)
Spoke with the patient about her lab results and she expressed understanding.

## 2015-11-24 ENCOUNTER — Other Ambulatory Visit: Payer: Self-pay | Admitting: Physician Assistant

## 2015-11-24 NOTE — Telephone Encounter (Signed)
Med refill approved

## 2015-12-22 ENCOUNTER — Other Ambulatory Visit: Payer: Self-pay | Admitting: Physician Assistant

## 2015-12-22 NOTE — Telephone Encounter (Signed)
Med refill approved

## 2015-12-24 ENCOUNTER — Other Ambulatory Visit: Payer: Self-pay | Admitting: Physician Assistant

## 2015-12-24 DIAGNOSIS — Z1231 Encounter for screening mammogram for malignant neoplasm of breast: Secondary | ICD-10-CM

## 2015-12-30 ENCOUNTER — Ambulatory Visit
Admission: RE | Admit: 2015-12-30 | Discharge: 2015-12-30 | Disposition: A | Discharge status: Home / Self Care | Payer: Managed Care, Other (non HMO) | Source: Ambulatory Visit | Attending: Physician Assistant | Admitting: Physician Assistant

## 2015-12-30 DIAGNOSIS — Z1231 Encounter for screening mammogram for malignant neoplasm of breast: Secondary | ICD-10-CM | POA: Diagnosis present

## 2016-01-21 ENCOUNTER — Other Ambulatory Visit: Payer: Self-pay | Admitting: Obstetrics and Gynecology

## 2016-01-21 DIAGNOSIS — M810 Age-related osteoporosis without current pathological fracture: Secondary | ICD-10-CM

## 2016-01-29 ENCOUNTER — Ambulatory Visit: Payer: Managed Care, Other (non HMO)

## 2016-03-15 ENCOUNTER — Other Ambulatory Visit: Payer: Self-pay | Admitting: Physician Assistant

## 2016-03-16 NOTE — Telephone Encounter (Signed)
Med refill approved

## 2016-04-12 ENCOUNTER — Other Ambulatory Visit: Payer: Self-pay | Admitting: Physician Assistant

## 2016-07-20 ENCOUNTER — Other Ambulatory Visit: Payer: Self-pay | Admitting: Physician Assistant

## 2016-07-20 NOTE — Telephone Encounter (Signed)
Med refill approved

## 2016-08-18 ENCOUNTER — Telehealth: Payer: Self-pay | Admitting: Physician Assistant

## 2016-08-18 ENCOUNTER — Other Ambulatory Visit: Payer: Self-pay | Admitting: Physician Assistant

## 2016-08-18 MED ORDER — BUPROPION HCL ER (SR) 150 MG PO TB12: ORAL_TABLET | ORAL | 3 refills | Status: DC

## 2016-08-18 NOTE — Progress Notes (Signed)
Pt needs refill on wellbutrin, sent refill to [facility], pt needs labs and clinic visit prior to additional refills

## 2016-11-14 ENCOUNTER — Other Ambulatory Visit: Payer: Self-pay | Admitting: Physician Assistant

## 2016-11-17 ENCOUNTER — Other Ambulatory Visit: Payer: Self-pay | Admitting: Physician Assistant

## 2016-11-23 ENCOUNTER — Other Ambulatory Visit: Payer: Self-pay | Admitting: Physician Assistant

## 2016-11-23 ENCOUNTER — Other Ambulatory Visit: Payer: Self-pay | Admitting: Obstetrics and Gynecology

## 2016-11-23 DIAGNOSIS — Z1231 Encounter for screening mammogram for malignant neoplasm of breast: Secondary | ICD-10-CM

## 2016-11-24 ENCOUNTER — Encounter: Payer: Self-pay | Admitting: Physician Assistant

## 2016-11-24 ENCOUNTER — Ambulatory Visit: Payer: Self-pay | Admitting: Physician Assistant

## 2016-11-24 VITALS — BP 100/79 | HR 76 | Temp 98.7°F | Ht 68.0 in | Wt 152.0 lb

## 2016-11-24 DIAGNOSIS — Z0189 Encounter for other specified special examinations: Principal | ICD-10-CM

## 2016-11-24 DIAGNOSIS — Z008 Encounter for other general examination: Secondary | ICD-10-CM

## 2016-11-24 DIAGNOSIS — Z0000 Encounter for general adult medical examination without abnormal findings: Secondary | ICD-10-CM

## 2016-11-24 MED ORDER — BUPROPION HCL ER (SR) 150 MG PO TB12: 150.0000 mg | ORAL_TABLET | Freq: Every day | ORAL | 3 refills | Status: DC

## 2016-11-24 MED ORDER — TRAZODONE HCL 100 MG PO TABS
ORAL_TABLET | ORAL | 3 refills | Status: DC
Start: 2016-11-24 — End: 2018-01-24

## 2016-11-24 NOTE — Progress Notes (Signed)
S: here for biometric form, will have regular physical with her gyn at [facility] Dr Georgianne Fick; has order for mammogram, needs repeat colonoscopy, had one at 72 andthey had to "cut things out" ; would like for fasting labs to be sent to her and Dr Georgianne Fick; also has rash on her chest, is red, will bleed on and off ; never completely goes away; states she is doing well on wellbutrin for smoking cessation, only taking 1 qd, not 2 bid, trazadone working well for insomnia; would like to get shingles vaccine since she turned 60;  pmhx reviewed, social hx reviewed, fam hx reviewed, remainder ros neg  O: vitals wnl, nad, lungs c t a, cv rrr,   A: biometric, wellness, insomnia  P: refill on meds x 1 year, will refer for colonoscopy and dermatology, shingles vaccine rx given, counseled on injection; will do baby boomer screening with fasting labs today, forward results to pt and DR Georgianne Fick

## 2016-11-25 LAB — HIV ANTIBODY (ROUTINE TESTING W REFLEX): HIV SCREEN 4TH GENERATION: NONREACTIVE

## 2016-11-25 LAB — CMP12+LP+TP+TSH+6AC+CBC/D/PLT
A/G RATIO: 2 (ref 1.2–2.2)
ALK PHOS: 96 IU/L (ref 39–117)
ALT: 14 IU/L (ref 0–32)
AST: 17 IU/L (ref 0–40)
Albumin: 4.5 g/dL (ref 3.6–4.8)
BASOS ABS: 0.1 10*3/uL (ref 0.0–0.2)
BILIRUBIN TOTAL: 0.3 mg/dL (ref 0.0–1.2)
BUN/Creatinine Ratio: 12 (ref 12–28)
BUN: 12 mg/dL (ref 8–27)
Basos: 1 %
CHOLESTEROL TOTAL: 155 mg/dL (ref 100–199)
Calcium: 9.7 mg/dL (ref 8.7–10.3)
Chloride: 103 mmol/L (ref 96–106)
Chol/HDL Ratio: 3.2 (ref 0.0–4.4)
Creatinine, Ser: 0.99 mg/dL (ref 0.57–1.00)
EOS (ABSOLUTE): 0.2 10*3/uL (ref 0.0–0.4)
EOS: 6 %
FREE THYROXINE INDEX: 2.3 (ref 1.2–4.9)
GFR calc Af Amer: 72 (ref >59)
GFR calc non Af Amer: 62 (ref >59)
GGT: 15 IU/L (ref 0–60)
Globulin, Total: 2.2 (ref 1.5–4.5)
Glucose: 85 mg/dL (ref 65–99)
HDL: 49 mg/dL (ref >39)
HEMOGLOBIN: 12.3 g/dL (ref 11.1–15.9)
Hematocrit: 37.6 % (ref 34.0–46.6)
IMMATURE GRANS (ABS): 0 10*3/uL (ref 0.0–0.1)
IMMATURE GRANULOCYTES: 0 %
Iron: 75 ug/dL (ref 27–159)
LDH: 143 IU/L (ref 119–226)
LDL CALC: 76 (ref 0–99)
LYMPHS ABS: 1.3 10*3/uL (ref 0.7–3.1)
LYMPHS: 37 %
MCH: 28.2 pg (ref 26.6–33.0)
MCHC: 32.7 g/dL (ref 31.5–35.7)
MCV: 86 fL (ref 79–97)
Monocytes Absolute: 0.3 10*3/uL (ref 0.1–0.9)
Monocytes: 9 %
NEUTROS PCT: 47 %
Neutrophils Absolute: 1.6 10*3/uL (ref 1.4–7.0)
PLATELETS: 242 10*3/uL (ref 150–379)
Phosphorus: 3.4 mg/dL (ref 2.5–4.5)
Potassium: 5.2 mmol/L (ref 3.5–5.2)
RBC: 4.36 x10E6/uL (ref 3.77–5.28)
RDW: 13.3 % (ref 12.3–15.4)
Sodium: 144 mmol/L (ref 134–144)
T3 Uptake Ratio: 27 % (ref 24–39)
T4, Total: 8.4 ug/dL (ref 4.5–12.0)
TOTAL PROTEIN: 6.7 g/dL (ref 6.0–8.5)
TRIGLYCERIDES: 149 mg/dL (ref 0–149)
TSH: 1.52 u[IU]/mL (ref 0.450–4.500)
Uric Acid: 4.6 mg/dL (ref 2.5–7.1)
VLDL CHOLESTEROL CAL: 30 (ref 5–40)
WBC: 3.5 10*3/uL (ref 3.4–10.8)

## 2016-11-25 LAB — HCV COMMENT:

## 2016-11-25 LAB — HEPATITIS C ANTIBODY (REFLEX)

## 2016-11-25 LAB — VITAMIN D 25 HYDROXY (VIT D DEFICIENCY, FRACTURES): Vit D, 25-Hydroxy: 41.4 ng/mL (ref 30.0–100.0)

## 2016-12-01 ENCOUNTER — Telehealth: Payer: Self-pay

## 2016-12-01 ENCOUNTER — Other Ambulatory Visit: Payer: Self-pay

## 2016-12-01 DIAGNOSIS — Z1211 Encounter for screening for malignant neoplasm of colon: Secondary | ICD-10-CM

## 2016-12-01 NOTE — Telephone Encounter (Signed)
Gastroenterology Pre-Procedure Review  Request Date:  Requesting Physician: Dr.   PATIENT REVIEW QUESTIONS: The patient responded to the following health history questions as indicated:    1. Are you having any GI issues? no 2. Do you have a personal history of Polyps? no 3. Do you have a family history of Colon Cancer or Polyps? no 4. Diabetes Mellitus? no 5. Joint replacements in the past 12 months?no 6. Major health problems in the past 3 months?no 7. Any artificial heart valves, MVP, or defibrillator?no    MEDICATIONS & ALLERGIES:    Patient reports the following regarding taking any anticoagulation/antiplatelet therapy:   Plavix, Coumadin, Eliquis, Xarelto, Lovenox, Pradaxa, Brilinta, or Effient? no Aspirin? no  Patient confirms/reports the following medications:  Current Outpatient Prescriptions  Medication Sig Dispense Refill  . ALPRAZolam (XANAX) 0.5 MG tablet TAKE 1 TABLET BY MOUTH TWICE A DAY AS NEEDED FOR ANXIETY  0  . buPROPion (WELLBUTRIN SR) 150 MG 12 hr tablet Take 1 tablet (150 mg total) by mouth daily. Take 1 po bid, or 2 po qd, Pt needs yearly labs prior to additional refills 90 tablet 3  . fluticasone (FLONASE) 50 MCG/ACT nasal spray Place 2 sprays into both nostrils daily. 16 g 6  . traZODone (DESYREL) 100 MG tablet TAKE 1 TABLET (100 MG TOTAL) BY MOUTH AT BEDTIME. 90 tablet 3   No current facility-administered medications for this visit.     Patient confirms/reports the following allergies:  No Known Allergies  No orders of the defined types were placed in this encounter.   AUTHORIZATION INFORMATION Primary Insurance: 1D#: Group #:  Secondary Insurance: 1D#: Group #:  SCHEDULE INFORMATION: Date: 12/17/16 Location: [place]

## 2016-12-14 ENCOUNTER — Encounter: Payer: Self-pay | Admitting: *Deleted

## 2016-12-17 NOTE — Discharge Instructions (Signed)

## 2016-12-20 ENCOUNTER — Encounter
Admission: RE | Disposition: A | Discharge status: Home / Self Care | Payer: Self-pay | Source: Ambulatory Visit | Attending: Gastroenterology

## 2016-12-20 ENCOUNTER — Ambulatory Visit
Admission: RE | Admit: 2016-12-20 | Discharge: 2016-12-20 | Disposition: A | Discharge status: Home / Self Care | Payer: Managed Care, Other (non HMO) | Source: Ambulatory Visit | Attending: Gastroenterology | Admitting: Gastroenterology

## 2016-12-20 ENCOUNTER — Ambulatory Visit: Payer: Managed Care, Other (non HMO) | Admitting: Anesthesiology

## 2016-12-20 DIAGNOSIS — Z1211 Encounter for screening for malignant neoplasm of colon: Secondary | ICD-10-CM | POA: Diagnosis not present

## 2016-12-20 DIAGNOSIS — Z79899 Other long term (current) drug therapy: Secondary | ICD-10-CM | POA: Insufficient documentation

## 2016-12-20 DIAGNOSIS — Z7951 Long term (current) use of inhaled steroids: Secondary | ICD-10-CM | POA: Insufficient documentation

## 2016-12-20 DIAGNOSIS — Z8719 Personal history of other diseases of the digestive system: Secondary | ICD-10-CM | POA: Diagnosis not present

## 2016-12-20 DIAGNOSIS — G4700 Insomnia, unspecified: Secondary | ICD-10-CM | POA: Insufficient documentation

## 2016-12-20 DIAGNOSIS — Z860100 Personal history of colon polyps, unspecified: Secondary | ICD-10-CM

## 2016-12-20 DIAGNOSIS — Z87891 Personal history of nicotine dependence: Secondary | ICD-10-CM | POA: Diagnosis not present

## 2016-12-20 HISTORY — DX: Presence of spectacles and contact lenses: Z97.3

## 2016-12-20 HISTORY — PX: COLONOSCOPY WITH PROPOFOL: SHX5780

## 2016-12-20 HISTORY — DX: Acute kidney failure, unspecified: N17.9

## 2016-12-20 SURGERY — COLONOSCOPY WITH PROPOFOL
Anesthesia: Monitor Anesthesia Care | Wound class: Contaminated

## 2016-12-20 MED ORDER — PROPOFOL 10 MG/ML IV BOLUS
INTRAVENOUS | Status: DC | PRN
  Administered 2016-12-20 (×6): 20 mg via INTRAVENOUS
  Administered 2016-12-20: 70 mg via INTRAVENOUS
  Administered 2016-12-20: 30 mg via INTRAVENOUS

## 2016-12-20 MED ORDER — ACETAMINOPHEN 325 MG PO TABS: 325.0000 mg | ORAL_TABLET | ORAL | Status: DC | PRN

## 2016-12-20 MED ORDER — ACETAMINOPHEN 160 MG/5ML PO SOLN: 325.0000 mg | ORAL | Status: DC | PRN

## 2016-12-20 MED ORDER — STERILE WATER FOR IRRIGATION IR SOLN
Status: DC | PRN
  Administered 2016-12-20: 10:00:00

## 2016-12-20 MED ORDER — LACTATED RINGERS IV SOLN
INTRAVENOUS | Status: DC
  Administered 2016-12-20: 09:00:00 via INTRAVENOUS

## 2016-12-20 MED ORDER — ONDANSETRON HCL 4 MG/2ML IJ SOLN: 4.0000 mg | Freq: Once | INTRAMUSCULAR | Status: DC | PRN

## 2016-12-20 MED ORDER — LIDOCAINE HCL (CARDIAC) 20 MG/ML IV SOLN
INTRAVENOUS | Status: DC | PRN
  Administered 2016-12-20: 20 mg via INTRAVENOUS

## 2016-12-20 SURGICAL SUPPLY — 23 items
CANISTER SUCT 1200ML W/VALVE (MISCELLANEOUS) ×3 IMPLANT
CLIP HMST 235XBRD CATH ROT (MISCELLANEOUS) IMPLANT
CLIP RESOLUTION 360 11X235 (MISCELLANEOUS)
FCP ESCP3.2XJMB 240X2.8X (MISCELLANEOUS)
FORCEPS BIOP RAD 4 LRG CAP 4 (CUTTING FORCEPS) IMPLANT
FORCEPS BIOP RJ4 240 W/NDL (MISCELLANEOUS)
FORCEPS ESCP3.2XJMB 240X2.8X (MISCELLANEOUS) IMPLANT
GOWN CVR UNV OPN BCK APRN NK (MISCELLANEOUS) ×2 IMPLANT
GOWN ISOL THUMB LOOP REG UNIV (MISCELLANEOUS) ×6
INJECTOR VARIJECT VIN23 (MISCELLANEOUS) IMPLANT
KIT DEFENDO VALVE AND CONN (KITS) IMPLANT
KIT ENDO PROCEDURE OLY (KITS) ×3 IMPLANT
MARKER SPOT ENDO TATTOO 5ML (MISCELLANEOUS) IMPLANT
PAD GROUND ADULT SPLIT (MISCELLANEOUS) IMPLANT
PROBE APC STR FIRE (PROBE) IMPLANT
RETRIEVER NET ROTH 2.5X230 LF (MISCELLANEOUS) IMPLANT
SNARE SHORT THROW 13M SML OVAL (MISCELLANEOUS) IMPLANT
SNARE SHORT THROW 30M LRG OVAL (MISCELLANEOUS) IMPLANT
SNARE SNG USE RND 15MM (INSTRUMENTS) IMPLANT
SPOT EX ENDOSCOPIC TATTOO (MISCELLANEOUS)
TRAP ETRAP POLY (MISCELLANEOUS) IMPLANT
VARIJECT INJECTOR VIN23 (MISCELLANEOUS)
WATER STERILE IRR 250ML POUR (IV SOLUTION) ×3 IMPLANT

## 2016-12-20 NOTE — Anesthesia Preprocedure Evaluation (Signed)
Anesthesia Evaluation  Patient identified by MRN, date of birth, ID band Patient awake    Reviewed: Allergy & Precautions, NPO status , Patient's Chart, lab work & pertinent test results  Airway Mallampati: I  TM Distance: >3 FB Neck ROM: Full    Dental no notable dental hx.    Pulmonary former smoker,    Pulmonary exam normal        Cardiovascular negative cardio ROS Normal cardiovascular exam     Neuro/Psych negative neurological ROS  negative psych ROS   GI/Hepatic negative GI ROS, Neg liver ROS,   Endo/Other  negative endocrine ROS  Renal/GU CRFRenal disease     Musculoskeletal negative musculoskeletal ROS (+)   Abdominal   Peds  Hematology negative hematology ROS (+)   Anesthesia Other Findings   Reproductive/Obstetrics                             Anesthesia Physical Anesthesia Plan  ASA: II  Anesthesia Plan: MAC   Post-op Pain Management:    Induction: Intravenous  Airway Management Planned:   Additional Equipment:   Intra-op Plan:   Post-operative Plan:   Informed Consent: I have reviewed the patients History and Physical, chart, labs and discussed the procedure including the risks, benefits and alternatives for the proposed anesthesia with the patient or authorized representative who has indicated his/her understanding and acceptance.     Plan Discussed with: CRNA  Anesthesia Plan Comments:         Anesthesia Quick Evaluation

## 2016-12-20 NOTE — Transfer of Care (Signed)
Immediate Anesthesia Transfer of Care Note  Patient: Aimee Stuart  Procedure(s) Performed: Procedure(s): COLONOSCOPY WITH PROPOFOL (N/A)  Patient Location: PACU  Anesthesia Type: MAC  Level of Consciousness: awake, alert  and patient cooperative  Airway and Oxygen Therapy: Patient Spontanous Breathing and Patient connected to supplemental oxygen  Post-op Assessment: Post-op Vital signs reviewed, Patient's Cardiovascular Status Stable, Respiratory Function Stable, Patent Airway and No signs of Nausea or vomiting  Post-op Vital Signs: Reviewed and stable  Complications: No apparent anesthesia complications

## 2016-12-20 NOTE — Anesthesia Postprocedure Evaluation (Signed)
Anesthesia Post Note  Patient: Aimee Stuart  Procedure(s) Performed: Procedure(s) (LRB): COLONOSCOPY WITH PROPOFOL (N/A)  Patient location during evaluation: PACU Anesthesia Type: MAC Level of consciousness: awake and alert and oriented Pain management: pain level controlled Vital Signs Assessment: post-procedure vital signs reviewed and stable Respiratory status: spontaneous breathing and nonlabored ventilation Cardiovascular status: stable Postop Assessment: no signs of nausea or vomiting and adequate PO intake Anesthetic complications: no    Estill Batten

## 2016-12-20 NOTE — Anesthesia Procedure Notes (Signed)
Procedure Name: MAC Performed by: Lind Guest Pre-anesthesia Checklist: Patient identified, Emergency Drugs available, Suction available, Patient being monitored and Timeout performed Patient Re-evaluated:Patient Re-evaluated prior to inductionOxygen Delivery Method: Nasal cannula

## 2016-12-20 NOTE — Op Note (Signed)
[facility] Patient Name: Aimee Stuart Procedure Date: 12/20/2016 9:48 AM MRN: [id number] Account #: [id number] Date of Birth: 1955-12-08 Admit Type: Outpatient Age: 61 Room: [facility] OR ROOM 01 Gender: Female Note Status: Finalized Procedure:            Colonoscopy Indications:          High risk colon cancer surveillance: Personal history                        of colonic polyps Providers:            [name] MD, MD Referring MD:         Versie Starks, MD (Referring MD) Medicines:            Propofol per Anesthesia Complications:        No immediate complications. Procedure:            Pre-Anesthesia Assessment:                       - Prior to the procedure, a History and Physical was                        performed, and patient medications and allergies were                        reviewed. The patient's tolerance of previous                        anesthesia was also reviewed. The risks and benefits of                        the procedure and the sedation options and risks were                        discussed with the patient. All questions were                        answered, and informed consent was obtained. Prior                        Anticoagulants: The patient has taken no previous                        anticoagulant or antiplatelet agents. ASA Grade                        Assessment: II - A patient with mild systemic disease.                        After reviewing the risks and benefits, the patient was                        deemed in satisfactory condition to undergo the                        procedure.                       After obtaining informed consent, the colonoscope was  passed under direct vision. Throughout the procedure,                        the patient's blood pressure, pulse, and oxygen                        saturations were monitored continuously. The [place] (S#: [id number]) was introduced through                        the anus and advanced to the the cecum, identified by                        appendiceal orifice and ileocecal valve. The                        colonoscopy was performed without difficulty. The                        patient tolerated the procedure well. The quality of                        the bowel preparation was poor. Findings:      The perianal and digital rectal examinations were normal.      A large amount of stool was found in the entire colon. Impression:           - Preparation of the colon was poor.                       - Stool in the entire examined colon.                       - No specimens collected. Recommendation:       - Discharge patient to home.                       - Resume previous diet.                       - Continue present medications.                       - Repeat colonoscopy in 1 year because the bowel                        preparation was poor. Procedure Code(s):    --- Professional ---                       [phone], Colonoscopy, flexible; diagnostic, including                        collection of specimen(s) by brushing or washing, when                        performed (separate procedure) Diagnosis Code(s):    --- Professional ---                       Z86.010, Personal history of colonic polyps CPT copyright 2016 American Medical Association. All rights reserved. The codes documented in this report are preliminary and upon coder review may  be revised to meet current compliance requirements. [name] MD, MD 12/20/2016 10:15:16 AM This report has been signed electronically. Number of Addenda: 0 Note Initiated On: 12/20/2016 9:48 AM Scope Withdrawal Time: 0 hours 6 minutes 55 seconds  Total Procedure Duration: 0 hours 15 minutes 14 seconds       [facility]

## 2016-12-20 NOTE — H&P (Signed)
  [name], MD [facility] [address], [place], [place] [address] Phone:[phone] Fax : [phone]  Primary Care Physician:  No primary care provider on file. Primary Gastroenterologist:  Dr. Allen Norris  Pre-Procedure History & Physical: HPI:  Aimee Stuart is a 61 y.o. female is here for an colonoscopy.   Past Medical History:  Diagnosis Date  . Acute renal failure (HCC)    due to bladder damage during hysterectomy  . Insomnia   . Wears contact lenses    sometimes    Past Surgical History:  Procedure Laterality Date  . ABDOMINAL HYSTERECTOMY    . BLADDER REPAIR     damaged during hysterectomy  . CESAREAN SECTION    . KNEE SURGERY Right    age 34    Prior to Admission medications   Medication Sig Start Date End Date Taking? Authorizing Provider  ALPRAZolam (XANAX) 0.5 MG tablet TAKE 1 TABLET BY MOUTH TWICE A DAY AS NEEDED FOR ANXIETY 01/22/16  Yes Historical Provider, MD  fluticasone (FLONASE) 50 MCG/ACT nasal spray Place 2 sprays into both nostrils daily. 09/03/15  Yes Versie Starks, PA-C  traZODone (DESYREL) 100 MG tablet TAKE 1 TABLET (100 MG TOTAL) BY MOUTH AT BEDTIME. 11/24/16  Yes Versie Starks, PA-C  buPROPion [facility] SR) 150 MG 12 hr tablet Take 1 tablet (150 mg total) by mouth daily. Take 1 po bid, or 2 po qd, Pt needs yearly labs prior to additional refills 11/24/16   Versie Starks, PA-C    Allergies as of 12/01/2016  . (No Known Allergies)    Family History  Problem Relation Age of Onset  . Alzheimer's disease Mother   . Cancer Father 82    prostate  . Heart disease Father     Social History   Social History  . Marital status: Married    Spouse name: N/A  . Number of children: N/A  . Years of education: N/A   Occupational History  . Not on file.   Social History Main Topics  . Smoking status: Former Smoker    Types: Cigarettes    Quit date: 10/06/2015  . Smokeless tobacco: Never Used  . Alcohol use 0.0 oz/week     Comment: 1-2  drinks/ month  . Drug use: Unknown  . Sexual activity: Not on file   Other Topics Concern  . Not on file   Social History Narrative  . No narrative on file    Review of Systems: See HPI, otherwise negative ROS  Physical Exam: BP 114/81   Pulse 78   Temp 98.1 F (36.7 C) (Tympanic)   Resp 16   Ht 5' 8"  (1.727 m)   Wt 149 lb (67.6 kg)   SpO2 99%   BMI 22.66 kg/m  General:   Alert,  pleasant and cooperative in NAD Head:  Normocephalic and atraumatic. Neck:  Supple; no masses or thyromegaly. Lungs:  Clear throughout to auscultation.    Heart:  Regular rate and rhythm. Abdomen:  Soft, nontender and nondistended. Normal bowel sounds, without guarding, and without rebound.   Neurologic:  Alert and  oriented x4;  grossly normal neurologically.  Impression/Plan: Aimee Stuart is here for an colonoscopy to be performed for history of colon polyps  Risks, benefits, limitations, and alternatives regarding  colonoscopy have been reviewed with the patient.  Questions have been answered.  All parties agreeable.   [name], MD  12/20/2016, 9:17 AM

## 2016-12-21 ENCOUNTER — Encounter: Payer: Self-pay | Admitting: Gastroenterology

## 2017-01-03 ENCOUNTER — Ambulatory Visit
Admission: RE | Admit: 2017-01-03 | Discharge: 2017-01-03 | Disposition: A | Discharge status: Home / Self Care | Payer: Managed Care, Other (non HMO) | Source: Ambulatory Visit | Attending: Obstetrics and Gynecology | Admitting: Obstetrics and Gynecology

## 2017-01-03 DIAGNOSIS — Z1231 Encounter for screening mammogram for malignant neoplasm of breast: Secondary | ICD-10-CM | POA: Diagnosis present

## 2017-01-04 ENCOUNTER — Encounter: Payer: Self-pay | Admitting: Obstetrics and Gynecology

## 2017-01-17 ENCOUNTER — Ambulatory Visit: Payer: Self-pay | Admitting: Obstetrics and Gynecology

## 2017-02-21 ENCOUNTER — Encounter: Payer: Self-pay | Admitting: Obstetrics and Gynecology

## 2017-02-21 ENCOUNTER — Ambulatory Visit (INDEPENDENT_AMBULATORY_CARE_PROVIDER_SITE_OTHER): Payer: Managed Care, Other (non HMO) | Admitting: Obstetrics and Gynecology

## 2017-02-21 VITALS — BP 124/80 | HR 81 | Ht 68.0 in | Wt 152.0 lb

## 2017-02-21 DIAGNOSIS — Z01419 Encounter for gynecological examination (general) (routine) without abnormal findings: Secondary | ICD-10-CM

## 2017-02-21 NOTE — Progress Notes (Signed)
Patient ID: Aimee Stuart, female   DOB: December 20, 1955, 61 y.o.   MRN: [id number]     Gynecology Annual Exam  PCP: Patient, No Pcp Per  Chief Complaint:  Chief Complaint  Patient presents with  . Gynecologic Exam    History of Present Illness:Patient is a 61 y.o. G2P2 presents for annual exam. The patient has no complaints today.   LMP: No LMP recorded. Patient has had a hysterectomy. No postmenopausal bleeding, no significant vasomotor symptoms  The patient is sexually active. She denies dyspareunia.  The patient does not perform self breast exams.  There is no notable family history of breast or ovarian cancer in her family.  The patient wears seatbelts: yes.   The patient has regular exercise: no.    The patient denies current symptoms of depression.     Review of Systems: Review of Systems  Constitutional: Negative for chills and fever.  HENT: Negative for congestion.   Respiratory: Negative for cough and shortness of breath.   Cardiovascular: Negative for chest pain and palpitations.  Gastrointestinal: Negative for abdominal pain, constipation, diarrhea, heartburn, nausea and vomiting.  Genitourinary: Negative for dysuria, frequency and urgency.  Skin: Negative for itching and rash.  Neurological: Negative for dizziness and headaches.  Endo/Heme/Allergies: Negative for polydipsia.  Psychiatric/Behavioral: Negative for depression.    Past Medical History:  Past Medical History:  Diagnosis Date  . Acute renal failure (HCC)    due to bladder damage during hysterectomy  . Insomnia   . Wears contact lenses    sometimes    Past Surgical History:  Past Surgical History:  Procedure Laterality Date  . ABDOMINAL HYSTERECTOMY    . BLADDER REPAIR     damaged during hysterectomy  . CESAREAN SECTION    . COLONOSCOPY WITH PROPOFOL N/A 12/20/2016   Procedure: COLONOSCOPY WITH PROPOFOL;  Surgeon: [name], MD;  Location: [facility];  Service: Endoscopy;   Laterality: N/A;  . KNEE SURGERY Right    age 2    Gynecologic History:  No LMP recorded. Patient has had a hysterectomy. Last mammogram:01/03/17 Results were: BI-RAD I Obstetric History: G2P2  Family History:  Family History  Problem Relation Age of Onset  . Alzheimer's disease Mother   . Cancer Father 25       prostate  . Heart disease Father     Social History:  Social History   Social History  . Marital status: Married    Spouse name: N/A  . Number of children: N/A  . Years of education: N/A   Occupational History  . Not on file.   Social History Main Topics  . Smoking status: Former Smoker    Types: Cigarettes    Quit date: 10/06/2015  . Smokeless tobacco: Never Used  . Alcohol use 0.0 oz/week     Comment: 1-2 drinks/ month  . Drug use: No  . Sexual activity: No   Other Topics Concern  . Not on file   Social History Narrative  . No narrative on file    Allergies:  No Known Allergies  Medications: Prior to Admission medications   Medication Sig Start Date End Date Taking? Authorizing Provider  ALPRAZolam (XANAX) 0.5 MG tablet TAKE 1 TABLET BY MOUTH TWICE A DAY AS NEEDED FOR ANXIETY 01/22/16  Yes [provider]  buPROPion (WELLBUTRIN SR) 150 MG 12 hr tablet Take 1 tablet (150 mg total) by mouth daily. Take 1 po bid, or 2 po qd, Pt needs yearly labs prior  to additional refills 11/24/16  Yes Fisher, Linden Dolin, PA-C  fluticasone [facility]) 50 MCG/ACT nasal spray Place 2 sprays into both nostrils daily. 09/03/15  Yes Versie Starks, PA-C  traZODone (DESYREL) 100 MG tablet TAKE 1 TABLET (100 MG TOTAL) BY MOUTH AT BEDTIME. 11/24/16  Yes Fisher, Linden Dolin, PA-C    Physical Exam Vitals: Blood pressure 124/80, pulse 81, height 5' 8"  (1.727 m), weight 152 lb (68.9 kg).  General: NAD HEENT: normocephalic, anicteric Thyroid: no enlargement, no palpable nodules Pulmonary: No increased work of breathing, CTAB Cardiovascular: RRR, distal pulses 2+ Breast:  Breast symmetrical, no tenderness, no palpable nodules or masses, no skin or nipple retraction present, no nipple discharge.  No axillary or supraclavicular lymphadenopathy. Abdomen: NABS, soft, non-tender, non-distended.  Umbilicus without lesions.  No hepatomegaly, splenomegaly or masses palpable. No evidence of hernia  Genitourinary:  External: Normal external female genitalia.  Normal urethral meatus, normal  Bartholin's and Skene's glands.    Vagina: Normal vaginal mucosa, no evidence of prolapse.    Cervix: Grossly normal in appearance, no bleeding  Uterus: Non-enlarged, mobile, normal contour.  No CMT  Adnexa: ovaries non-enlarged, no adnexal masses  Rectal: deferred  Lymphatic: no evidence of inguinal lymphadenopathy Extremities: no edema, erythema, or tenderness Neurologic: Grossly intact Psychiatric: mood appropriate, affect full  Female chaperone present for pelvic and breast  portions of the physical exam    Assessment: 61 y.o. G2P2 No problem-specific Assessment & Plan notes found for this encounter.   Plan: Problem List Items Addressed This Visit    None    Visit Diagnoses    Women's annual routine gynecological examination    -  Primary      1) Mammogram - recommend yearly screening mammogram.  Mammogram Is up to date  2) STI screening not offered/discussed  3) ASCCP guidelines and rational discussed s/p TLH no further paps indicated no history of CIN II  4) Osteoporosis  - per USPTF routine screening DEXA at age 61  5) Routine healthcare maintenance including cholesterol, diabetes screening discussed managed by PCP  6) Colonoscopy up to date 01/03/17 repeat 1 year because of poor prep with Dr. Allen Norris  7) Follow up 1 year for routine annual

## 2017-02-21 NOTE — Patient Instructions (Signed)

## 2017-07-24 IMAGING — MG MM DIGITAL SCREENING BILAT W/ TOMO W/ CAD
8 of 12 series · 8 of 28 positions shown · non-contrast
Comparison: Previous exam(s).

CLINICAL DATA: Screening.

EXAM:
2D DIGITAL SCREENING BILATERAL MAMMOGRAM WITH CAD AND ADJUNCT TOMO

[L MLO synth-2D]
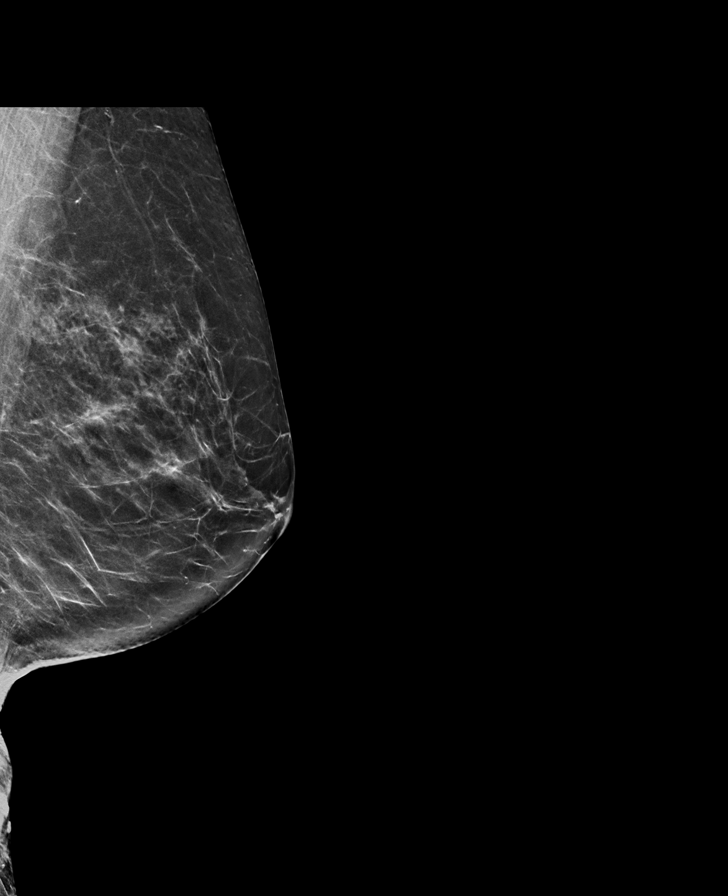

[R CC synth-2D]
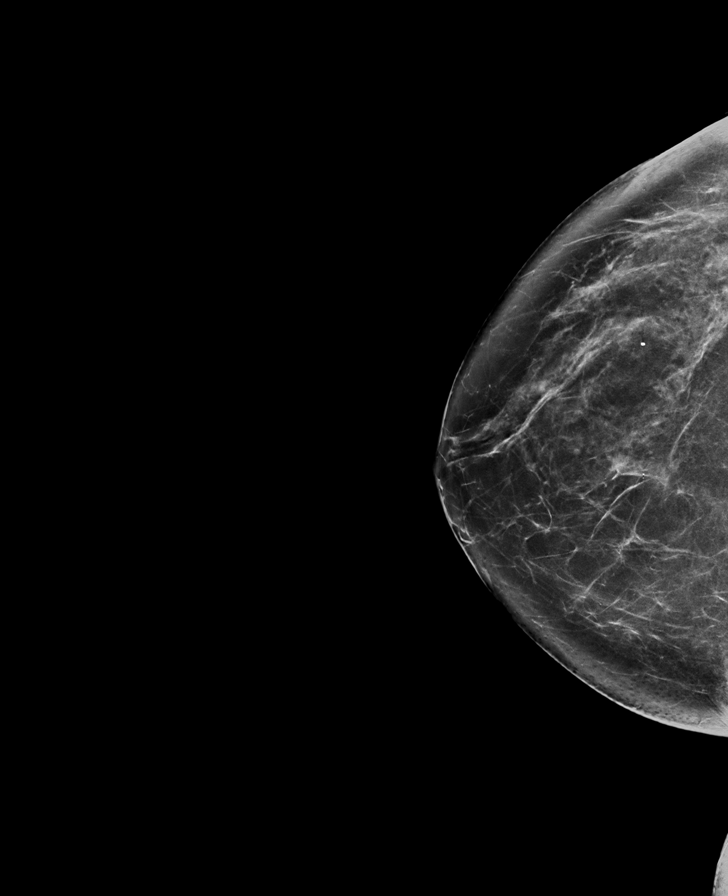

[R MLO synth-2D]
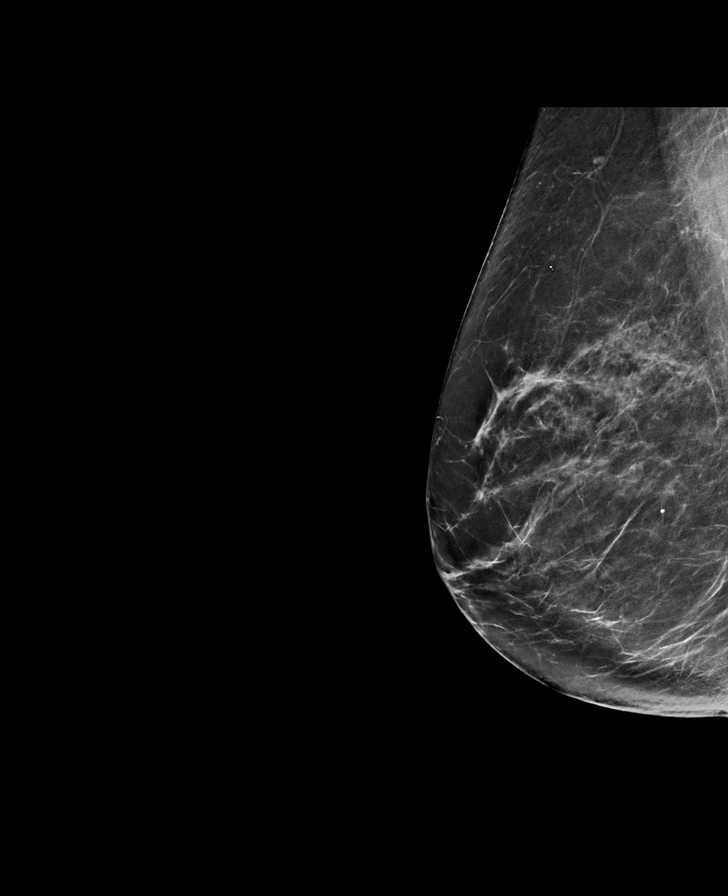

[R CC]
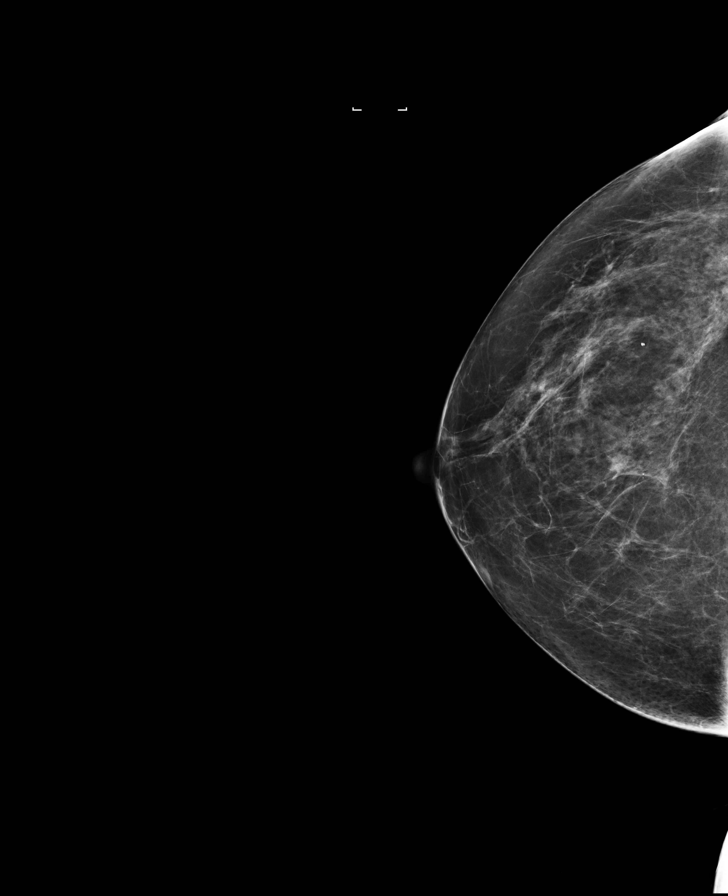

[L CC]
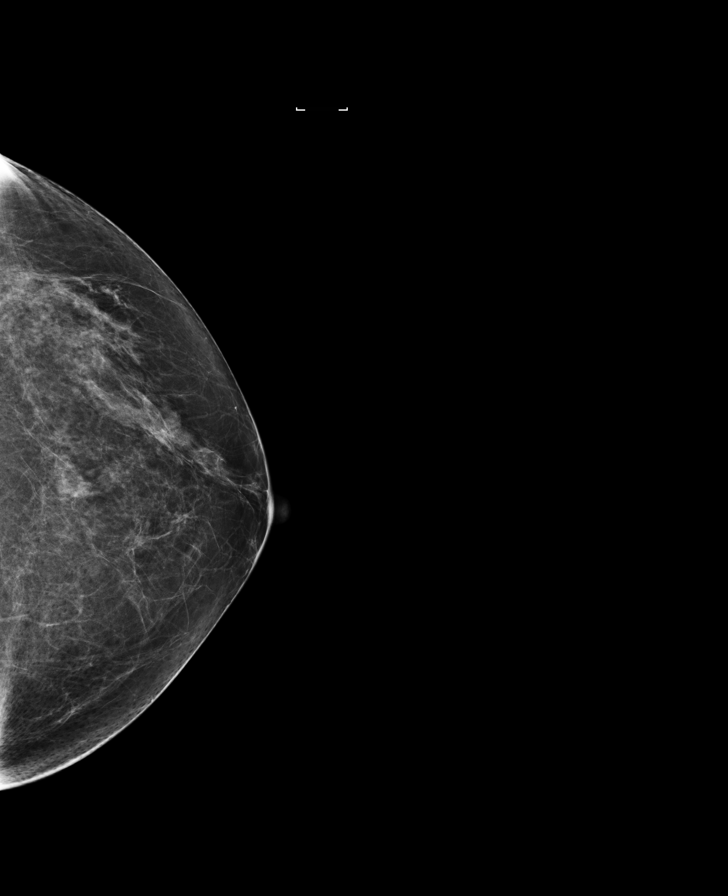

[L CC synth-2D]
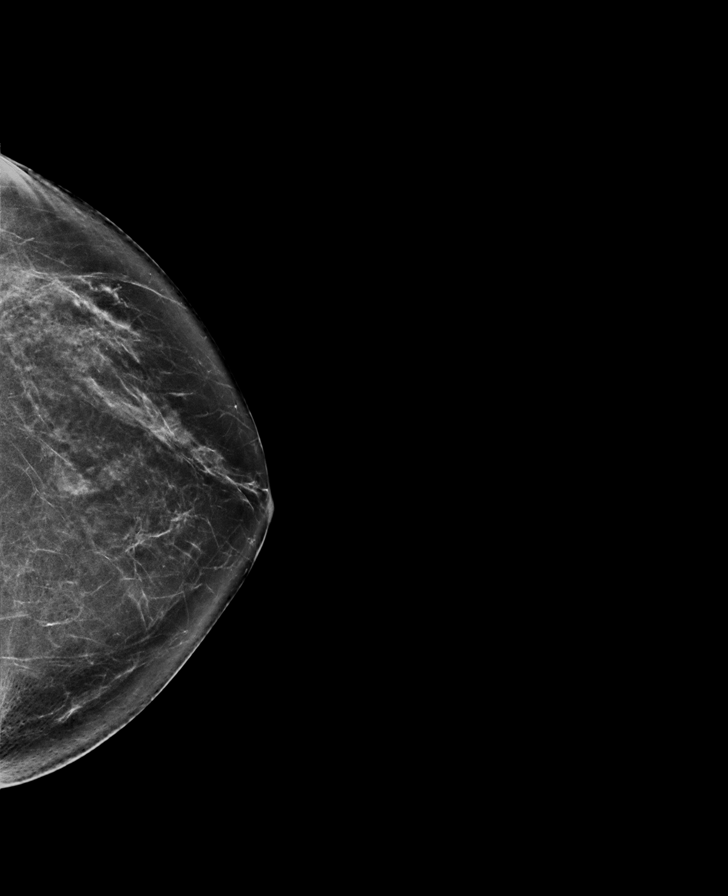

[R MLO]
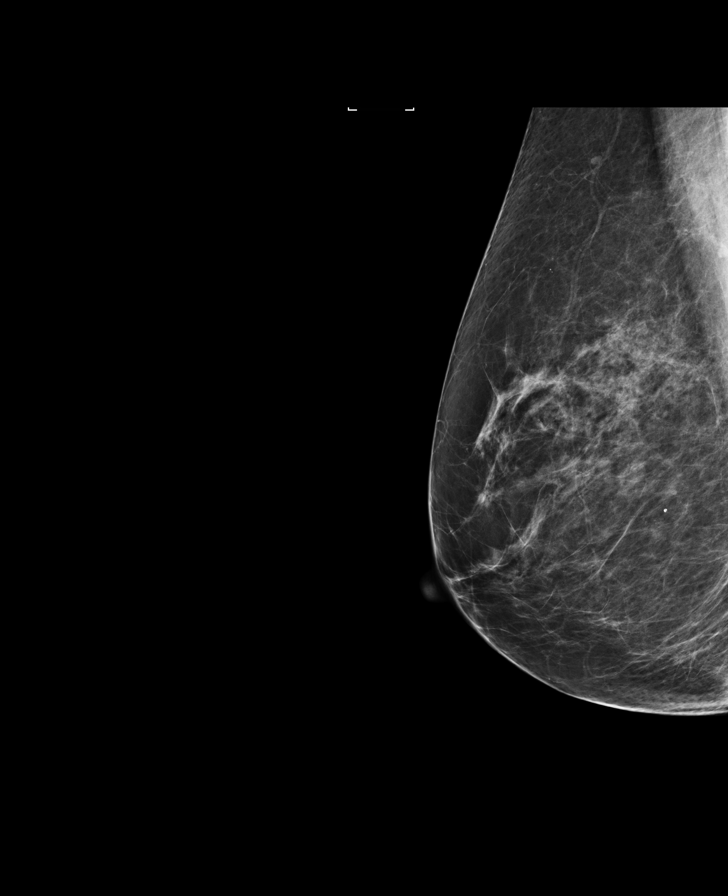

[L MLO]
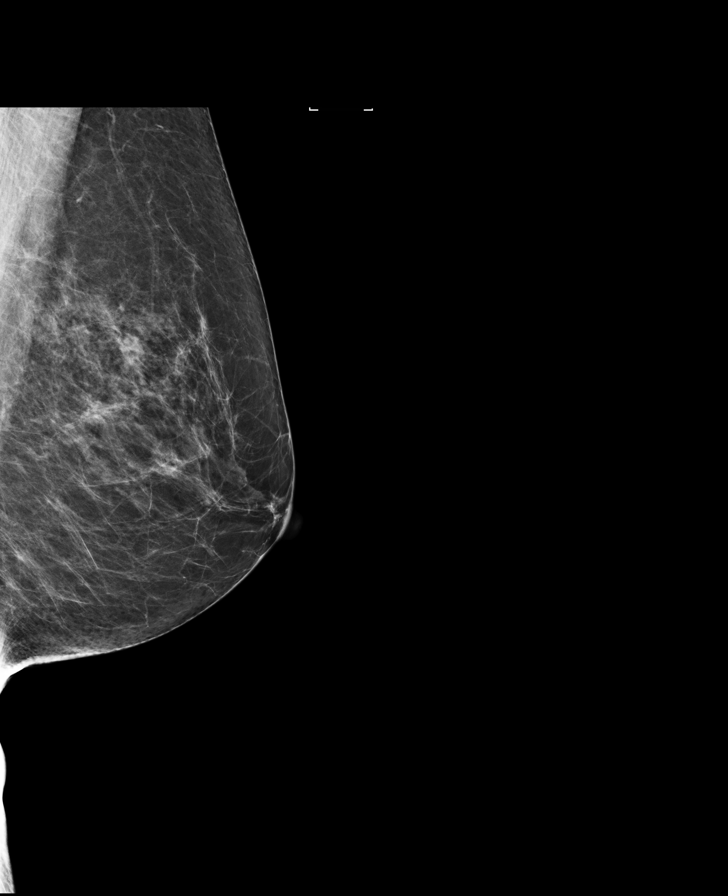

[8 of 28 positions shown; findings below may reference images not displayed]

ACR Breast Density Category b: There are scattered areas of
fibroglandular density.
FINDINGS: There are no findings suspicious for malignancy. Images were
processed with CAD.
IMPRESSION: No mammographic evidence of malignancy. A result letter of this
screening mammogram will be mailed directly to the patient.

RECOMMENDATION:
Screening mammogram in one year. (Code:97-6-RS4)

BI-RADS CATEGORY  1: Negative.

## 2017-12-29 ENCOUNTER — Other Ambulatory Visit: Payer: Self-pay | Admitting: Obstetrics and Gynecology

## 2018-01-19 ENCOUNTER — Ambulatory Visit: Payer: Self-pay | Admitting: Family Medicine

## 2018-01-19 VITALS — BP 140/80 | HR 76 | Temp 98.0°F | Resp 16 | Ht 68.0 in | Wt 147.0 lb

## 2018-01-19 DIAGNOSIS — Z008 Encounter for other general examination: Secondary | ICD-10-CM

## 2018-01-19 DIAGNOSIS — Z0189 Encounter for other specified special examinations: Principal | ICD-10-CM

## 2018-01-19 LAB — GLUCOSE, POCT (MANUAL RESULT ENTRY): POC Glucose: 90 mg/dl (ref 70–99)

## 2018-01-19 NOTE — Progress Notes (Signed)
Subjective: Annual biometrics screening  Patient presents for her annual biometric screening. Patient has a history of insomnia.  Patient does not currently have a PCP and uses her OB/GYN for this. Patient works for [employer] Patient denies any other issues or concerns.   Review of Systems Unremarkable  Objective  Physical Exam General: Awake, alert and oriented. No acute distress. Well developed, hydrated and nourished. Appears stated age.  HEENT: Supple neck without adenopathy. Sclera is non-icteric. The ear canal is clear without discharge. The tympanic membrane is normal in appearance with normal landmarks and cone of light. Nasal mucosa is pink and moist. Oral mucosa is pink and moist. The pharynx is normal in appearance without tonsillar swelling or exudates.  Skin: Skin in warm, dry and intact without rashes or lesions. Appropriate color for ethnicity. Cardiac: Heart rate and rhythm are normal. No murmurs, gallops, or rubs are auscultated.  Respiratory: The chest wall is symmetric and without deformity. No signs of respiratory distress. Lung sounds are clear in all lobes bilaterally without rales, ronchi, or wheezes.  Neurological: The patient is awake, alert and oriented to person, place, and time with normal speech.  Memory is normal and thought processes intact. No gait abnormalities are appreciated.  Psychiatric: Appropriate mood and affect.   Assessment Annual biometrics screening  Plan  Lipid panel pending. Encouraged routine visits with primary care provider.  Encourage patient to establish care with a primary care provider within the next month.  Patient declined needing resources for this. Fasting blood sugar 90 today. Blood pressure 140/88 today.  Advised patient to monitor this and discussed reporting abnormal values to her primary care provider.

## 2018-01-20 LAB — LIPID PANEL
CHOLESTEROL TOTAL: 174 mg/dL (ref 100–199)
Chol/HDL Ratio: 3 ratio (ref 0.0–4.4)
HDL: 58 mg/dL (ref >39)
LDL Calculated: 91 mg/dL (ref 0–99)
Triglycerides: 124 mg/dL (ref 0–149)
VLDL CHOLESTEROL CAL: 25 mg/dL (ref 5–40)

## 2018-01-20 NOTE — Progress Notes (Signed)
Lipid panel results are normal.

## 2018-01-24 ENCOUNTER — Other Ambulatory Visit: Payer: Self-pay | Admitting: Family Medicine

## 2018-01-24 DIAGNOSIS — F329 Major depressive disorder, single episode, unspecified: Secondary | ICD-10-CM

## 2018-01-24 DIAGNOSIS — G4700 Insomnia, unspecified: Secondary | ICD-10-CM

## 2018-01-24 DIAGNOSIS — F32A Depression, unspecified: Secondary | ICD-10-CM

## 2018-01-24 MED ORDER — BUPROPION HCL ER (SR) 150 MG PO TB12: 150.0000 mg | ORAL_TABLET | Freq: Every day | ORAL | 0 refills | Status: DC

## 2018-01-24 MED ORDER — TRAZODONE HCL 100 MG PO TABS: ORAL_TABLET | ORAL | 0 refills | Status: DC

## 2018-01-24 NOTE — Progress Notes (Signed)
Patient called requesting a refill for her trazodone and Wellbutrin until she can establish care with a new primary care provider.  She has taken these both together for years without any side/adverse effects.  Informed patient that I would provide her with 1 refill for each and that she needed to obtain future refills from her primary care provider, who will be responsible for monitoring this.  I was concerned about the interaction between trazodone and Wellbutrin, I called and spoke with the pharmacist at the patient's pharmacy [facility]) who confirmed that the patient had been taking these for years and said it would be safe for her to continue taking these together in addition to Xanax.

## 2018-02-22 ENCOUNTER — Other Ambulatory Visit: Payer: Self-pay | Admitting: Family Medicine

## 2018-02-22 DIAGNOSIS — F32A Depression, unspecified: Secondary | ICD-10-CM

## 2018-02-22 DIAGNOSIS — G4700 Insomnia, unspecified: Secondary | ICD-10-CM

## 2018-02-22 DIAGNOSIS — F329 Major depressive disorder, single episode, unspecified: Secondary | ICD-10-CM

## 2018-02-22 MED ORDER — TRAZODONE HCL 100 MG PO TABS: ORAL_TABLET | ORAL | 0 refills | Status: DC

## 2018-02-22 MED ORDER — BUPROPION HCL ER (SR) 150 MG PO TB12: 150.0000 mg | ORAL_TABLET | Freq: Every day | ORAL | 0 refills | Status: DC

## 2018-02-22 NOTE — Progress Notes (Signed)
Providing patient with 1 additional refill of her trazodone and bupropion so that she does not run out of these medications prior to the appointment she has scheduled to establish care with a new primary care provider on 03/21/2018.  Future monitoring/refills need to come from her primary care provider.

## 2018-02-28 ENCOUNTER — Encounter: Payer: Self-pay | Admitting: Obstetrics and Gynecology

## 2018-02-28 ENCOUNTER — Ambulatory Visit (INDEPENDENT_AMBULATORY_CARE_PROVIDER_SITE_OTHER): Payer: Managed Care, Other (non HMO) | Admitting: Obstetrics and Gynecology

## 2018-02-28 DIAGNOSIS — Z01419 Encounter for gynecological examination (general) (routine) without abnormal findings: Secondary | ICD-10-CM | POA: Diagnosis not present

## 2018-02-28 DIAGNOSIS — Z0000 Encounter for general adult medical examination without abnormal findings: Secondary | ICD-10-CM | POA: Diagnosis not present

## 2018-02-28 DIAGNOSIS — G4700 Insomnia, unspecified: Secondary | ICD-10-CM

## 2018-02-28 MED ORDER — TRAZODONE HCL 100 MG PO TABS: ORAL_TABLET | ORAL | 3 refills | Status: DC

## 2018-02-28 MED ORDER — VARENICLINE TARTRATE 0.5 MG X 11 & 1 MG X 42 PO MISC: ORAL | 0 refills | Status: DC

## 2018-02-28 MED ORDER — VARENICLINE TARTRATE 1 MG PO TABS: 1.0000 mg | ORAL_TABLET | Freq: Two times a day (BID) | ORAL | 2 refills | Status: DC

## 2018-02-28 MED ORDER — CLONAZEPAM 0.5 MG PO TABS: 0.5000 mg | ORAL_TABLET | Freq: Two times a day (BID) | ORAL | 0 refills | Status: DC | PRN

## 2018-02-28 NOTE — Patient Instructions (Signed)
Dr. Meriel Flavors [facility] Dr. Vernard Gambles

## 2018-02-28 NOTE — Progress Notes (Signed)
Gynecology Annual Exam  PCP: Patient, No Pcp Per  Chief Complaint:  Chief Complaint  Patient presents with  . Gynecologic Exam    Recent labwork     History of Present Illness:Patient is a 62 y.o. G2P2 presents for annual exam. The patient has no complaints today.   LMP: No LMP recorded. Patient has had a hysterectomy.  The patient is sexually active. She denies dyspareunia.  The patient does perform self breast exams.  There is no notable family history of breast or ovarian cancer in her family.  The patient wears seatbelts: yes.   The patient has regular exercise: not asked.    The patient reports current symptoms of occasional anxiety.     Review of Systems: Review of Systems  Constitutional: Negative for chills and fever.  HENT: Negative for congestion.   Respiratory: Negative for cough and shortness of breath.   Cardiovascular: Negative for chest pain and palpitations.  Gastrointestinal: Negative for abdominal pain, constipation, diarrhea, heartburn, nausea and vomiting.  Genitourinary: Negative for dysuria, frequency and urgency.  Skin: Negative for itching and rash.  Neurological: Negative for dizziness and headaches.  Endo/Heme/Allergies: Negative for polydipsia.  Psychiatric/Behavioral: Negative for depression.    Past Medical History:  Past Medical History:  Diagnosis Date  . Acute renal failure (HCC)    due to bladder damage during hysterectomy  . Insomnia   . Wears contact lenses    sometimes    Past Surgical History:  Past Surgical History:  Procedure Laterality Date  . ABDOMINAL HYSTERECTOMY  2013   [facility]  . BLADDER REPAIR     damaged during hysterectomy  . CERVICAL BIOPSY  W/ LOOP ELECTRODE EXCISION  07/2008  . CESAREAN SECTION    . COLONOSCOPY WITH PROPOFOL N/A 12/20/2016   Procedure: COLONOSCOPY WITH PROPOFOL;  Surgeon: [name], MD;  Location: [facility];  Service: Endoscopy;  Laterality: N/A;  . COLPOSCOPY  2009  . KNEE  SURGERY Right    age 56    Gynecologic History:  No LMP recorded. Patient has had a hysterectomy. Last Pap: Results were: N/A s/p hysterectomy Last mammogram: 01/03/2017 Results were: BI-RAD I  Obstetric History: G2P2  Family History:  Family History  Problem Relation Age of Onset  . Alzheimer's disease Mother   . Cancer Father 64       prostate  . Heart disease Father     Social History:  Social History   Socioeconomic History  . Marital status: Married    Spouse name: Not on file  . Number of children: Not on file  . Years of education: Not on file  . Highest education level: Not on file  Occupational History  . Not on file  Social Needs  . Financial resource strain: Not on file  . Food insecurity:    Worry: Not on file    Inability: Not on file  . Transportation needs:    Medical: Not on file    Non-medical: Not on file  Tobacco Use  . Smoking status: Former Smoker    Types: Cigarettes    Last attempt to quit: 10/06/2015    Years since quitting: 2.4  . Smokeless tobacco: Never Used  Substance and Sexual Activity  . Alcohol use: Yes    Alcohol/week: 0.0 oz    Comment: 1-2 drinks/ month  . Drug use: No  . Sexual activity: Never  Lifestyle  . Physical activity:    Days per week: Not on file  Minutes per session: Not on file  . Stress: Not on file  Relationships  . Social connections:    Talks on phone: Not on file    Gets together: Not on file    Attends religious service: Not on file    Active member of club or organization: Not on file    Attends meetings of clubs or organizations: Not on file    Relationship status: Not on file  . Intimate partner violence:    Fear of current or ex partner: Not on file    Emotionally abused: Not on file    Physically abused: Not on file    Forced sexual activity: Not on file  Other Topics Concern  . Not on file  Social History Narrative  . Not on file    Allergies:  No Known  Allergies  Medications: Prior to Admission medications   Medication Sig Start Date End Date Taking? Authorizing Provider  buPROPion (WELLBUTRIN SR) 150 MG 12 hr tablet Take 1 tablet (150 mg total) by mouth daily. 02/22/18  Yes D'Jernes, Naomie Dean, FNP  fluticasone (FLONASE) 50 MCG/ACT nasal spray Place 2 sprays into both nostrils daily. 09/03/15  Yes Versie Starks, PA-C  traZODone (DESYREL) 100 MG tablet TAKE 1 TABLET (100 MG TOTAL) BY MOUTH AT BEDTIME. 02/22/18  Yes D'Jernes, Naomie Dean, FNP  ALPRAZolam Duanne Moron) 0.5 MG tablet TAKE 1 TABLET BY MOUTH TWICE A DAY AS NEEDED FOR ANXIETY 01/22/16   [provider]    Physical Exam Vitals: Blood pressure 128/68, pulse 84, height 5' 8"  (1.727 m), weight 151 lb (68.5 kg).  General: NAD HEENT: normocephalic, anicteric Thyroid: no enlargement, no palpable nodules Pulmonary: No increased work of breathing, CTAB Cardiovascular: RRR, distal pulses 2+ Breast: Breast symmetrical, no tenderness, no palpable nodules or masses, no skin or nipple retraction present, no nipple discharge.  No axillary or supraclavicular lymphadenopathy. Abdomen: NABS, soft, non-tender, non-distended.  Umbilicus without lesions.  No hepatomegaly, splenomegaly or masses palpable. No evidence of hernia  Genitourinary:  External: Normal external female genitalia.  Normal urethral meatus, normal Bartholin's and Skene's glands.    Vagina: Normal vaginal mucosa, no evidence of prolapse.    Cervix: Grossly normal in appearance, no bleeding  Uterus: Non-enlarged, mobile, normal contour.  No CMT  Adnexa: ovaries non-enlarged, no adnexal masses  Rectal: deferred  Lymphatic: no evidence of inguinal lymphadenopathy Extremities: no edema, erythema, or tenderness Neurologic: Grossly intact Psychiatric: mood appropriate, affect full  Female chaperone present for pelvic and breast  portions of the physical exam     Assessment: 62 y.o. G2P2 routine annual  exam  Plan: Problem List Items Addressed This Visit    None    Visit Diagnoses    Insomnia, unspecified type       Relevant Medications   traZODone (DESYREL) 100 MG tablet      1) Mammogram - recommend yearly screening mammogram.  Mammogram Was ordered today  2) STI screening  was notoffered and therefore not obtained  3) ASCCP guidelines and rational discussed.  Patient opts for discontinue secondary to prior hysterectomy screening interval  4) Osteoporosis  - per USPTF routine screening DEXA at age 30  5) Routine healthcare maintenance including cholesterol, diabetes screening discussed managed by PCP  6) Colonoscopy up to date 01/03/17 with DR. Wohl with repeat 1 year poor prep  7) Return in about 1 year (around 03/01/2019) for annual.    [name], MD Mosetta Pigeon, [facility] 02/28/2018, 3:54  PM       .

## 2018-03-21 ENCOUNTER — Ambulatory Visit: Payer: Self-pay | Admitting: Internal Medicine

## 2018-03-27 ENCOUNTER — Telehealth: Payer: Self-pay

## 2018-03-27 NOTE — Telephone Encounter (Signed)
Pharmacy notifed

## 2018-03-27 NOTE — Telephone Encounter (Signed)
Please notify the pharmacy to forward refill request to the patient's primary care provider.

## 2018-03-27 NOTE — Telephone Encounter (Signed)
Received fax requesting refill for Bupropion HCL SR 163m Tab.

## 2018-05-02 DIAGNOSIS — G4700 Insomnia, unspecified: Secondary | ICD-10-CM | POA: Insufficient documentation

## 2018-05-02 DIAGNOSIS — F419 Anxiety disorder, unspecified: Secondary | ICD-10-CM | POA: Insufficient documentation

## 2018-05-02 DIAGNOSIS — Z720 Tobacco use: Secondary | ICD-10-CM

## 2018-05-02 HISTORY — DX: Tobacco use: Z72.0

## 2018-07-01 ENCOUNTER — Other Ambulatory Visit: Payer: Self-pay | Admitting: Obstetrics and Gynecology

## 2018-07-01 DIAGNOSIS — G4700 Insomnia, unspecified: Secondary | ICD-10-CM

## 2019-05-28 ENCOUNTER — Telehealth: Payer: Self-pay

## 2019-05-28 ENCOUNTER — Other Ambulatory Visit: Payer: Self-pay | Admitting: Obstetrics and Gynecology

## 2019-05-28 MED ORDER — VARENICLINE TARTRATE 1 MG PO TABS: 1.0000 mg | ORAL_TABLET | Freq: Two times a day (BID) | ORAL | 2 refills | Status: DC

## 2019-05-28 MED ORDER — CHANTIX STARTING MONTH PAK 0.5 MG X 11 & 1 MG X 42 PO TABS: ORAL_TABLET | ORAL | 0 refills | Status: DC

## 2019-05-28 NOTE — Telephone Encounter (Signed)
Fax as wont let me escribe

## 2019-05-28 NOTE — Telephone Encounter (Signed)
Faxed Rx

## 2019-05-28 NOTE — Telephone Encounter (Signed)
Patient didn't get original Chantix rx filled as it was too expensive. Rx has now expired. She is requesting it be sent to [facility] [place]. Pharmacy changed as pharmacy on file was [facility].

## 2019-06-16 ENCOUNTER — Other Ambulatory Visit: Payer: Self-pay | Admitting: Obstetrics and Gynecology

## 2019-06-16 DIAGNOSIS — G4700 Insomnia, unspecified: Secondary | ICD-10-CM

## 2019-12-11 ENCOUNTER — Other Ambulatory Visit: Payer: Self-pay

## 2019-12-11 ENCOUNTER — Other Ambulatory Visit: Payer: Managed Care, Other (non HMO)

## 2019-12-11 DIAGNOSIS — Z79899 Other long term (current) drug therapy: Secondary | ICD-10-CM

## 2019-12-11 DIAGNOSIS — Z13220 Encounter for screening for lipoid disorders: Secondary | ICD-10-CM | POA: Diagnosis not present

## 2019-12-12 LAB — COMPREHENSIVE METABOLIC PANEL
ALT: 23 IU/L (ref 0–32)
AST: 27 IU/L (ref 0–40)
Albumin/Globulin Ratio: 1.9 (ref 1.2–2.2)
Albumin: 4.8 g/dL (ref 3.8–4.8)
Alkaline Phosphatase: 114 IU/L (ref 39–117)
BUN/Creatinine Ratio: 15 (ref 12–28)
BUN: 14 mg/dL (ref 8–27)
Bilirubin Total: 0.4 mg/dL (ref 0.0–1.2)
CO2: 26 mmol/L (ref 20–29)
Calcium: 10.4 mg/dL — ABNORMAL HIGH (ref 8.7–10.3)
Chloride: 105 mmol/L (ref 96–106)
Creatinine, Ser: 0.94 mg/dL (ref 0.57–1.00)
GFR calc Af Amer: 75 mL/min/{1.73_m2} (ref >59)
GFR calc non Af Amer: 65 mL/min/{1.73_m2} (ref >59)
Globulin, Total: 2.5 g/dL (ref 1.5–4.5)
Glucose: 90 mg/dL (ref 65–99)
Potassium: 5.6 mmol/L — ABNORMAL HIGH (ref 3.5–5.2)
Sodium: 145 mmol/L — ABNORMAL HIGH (ref 134–144)
Total Protein: 7.3 g/dL (ref 6.0–8.5)

## 2019-12-12 LAB — CBC WITH DIFFERENTIAL/PLATELET
Basophils Absolute: 0.1 10*3/uL (ref 0.0–0.2)
Basos: 2 %
EOS (ABSOLUTE): 0.4 10*3/uL (ref 0.0–0.4)
Eos: 6 %
Hematocrit: 42 % (ref 34.0–46.6)
Hemoglobin: 13.6 g/dL (ref 11.1–15.9)
Immature Grans (Abs): 0 10*3/uL (ref 0.0–0.1)
Immature Granulocytes: 0 %
Lymphocytes Absolute: 2 10*3/uL (ref 0.7–3.1)
Lymphs: 30 %
MCH: 29.1 pg (ref 26.6–33.0)
MCHC: 32.4 g/dL (ref 31.5–35.7)
MCV: 90 fL (ref 79–97)
Monocytes Absolute: 0.8 10*3/uL (ref 0.1–0.9)
Monocytes: 12 %
Neutrophils Absolute: 3.4 10*3/uL (ref 1.4–7.0)
Neutrophils: 50 %
Platelets: 280 10*3/uL (ref 150–450)
RBC: 4.67 x10E6/uL (ref 3.77–5.28)
RDW: 12.9 % (ref 11.7–15.4)
WBC: 6.8 10*3/uL (ref 3.4–10.8)

## 2019-12-12 LAB — LIPID PANEL
Chol/HDL Ratio: 4.5 ratio — ABNORMAL HIGH (ref 0.0–4.4)
Cholesterol, Total: 214 mg/dL — ABNORMAL HIGH (ref 100–199)
HDL: 48 mg/dL (ref >39)
LDL Chol Calc (NIH): 118 mg/dL — ABNORMAL HIGH (ref 0–99)
Triglycerides: 278 mg/dL — ABNORMAL HIGH (ref 0–149)
VLDL Cholesterol Cal: 48 mg/dL — ABNORMAL HIGH (ref 5–40)

## 2019-12-17 ENCOUNTER — Emergency Department: Payer: Managed Care, Other (non HMO)

## 2019-12-17 ENCOUNTER — Inpatient Hospital Stay
Admission: EM | Admit: 2019-12-17 | Discharge: 2019-12-24 | DRG: 386 | Disposition: A | Discharge status: Home / Self Care | Payer: Managed Care, Other (non HMO) | Attending: Internal Medicine | Admitting: Internal Medicine

## 2019-12-17 ENCOUNTER — Encounter: Payer: Self-pay | Admitting: Emergency Medicine

## 2019-12-17 ENCOUNTER — Ambulatory Visit
Admission: EM | Admit: 2019-12-17 | Discharge: 2019-12-17 | Disposition: A | Discharge status: Other Acute Inpatient Hospital | Payer: Managed Care, Other (non HMO) | Source: Home / Self Care | Attending: Family Medicine | Admitting: Family Medicine

## 2019-12-17 ENCOUNTER — Other Ambulatory Visit: Payer: Self-pay

## 2019-12-17 DIAGNOSIS — Z20822 Contact with and (suspected) exposure to covid-19: Secondary | ICD-10-CM | POA: Diagnosis present

## 2019-12-17 DIAGNOSIS — K529 Noninfective gastroenteritis and colitis, unspecified: Secondary | ICD-10-CM

## 2019-12-17 DIAGNOSIS — F419 Anxiety disorder, unspecified: Secondary | ICD-10-CM | POA: Diagnosis present

## 2019-12-17 DIAGNOSIS — R112 Nausea with vomiting, unspecified: Secondary | ICD-10-CM | POA: Diagnosis not present

## 2019-12-17 DIAGNOSIS — I10 Essential (primary) hypertension: Secondary | ICD-10-CM | POA: Diagnosis not present

## 2019-12-17 DIAGNOSIS — E876 Hypokalemia: Secondary | ICD-10-CM | POA: Diagnosis present

## 2019-12-17 DIAGNOSIS — K269 Duodenal ulcer, unspecified as acute or chronic, without hemorrhage or perforation: Secondary | ICD-10-CM | POA: Diagnosis present

## 2019-12-17 DIAGNOSIS — E872 Acidosis: Secondary | ICD-10-CM | POA: Diagnosis present

## 2019-12-17 DIAGNOSIS — Z8249 Family history of ischemic heart disease and other diseases of the circulatory system: Secondary | ICD-10-CM | POA: Diagnosis not present

## 2019-12-17 DIAGNOSIS — Z820 Family history of epilepsy and other diseases of the nervous system: Secondary | ICD-10-CM | POA: Diagnosis not present

## 2019-12-17 DIAGNOSIS — Z79891 Long term (current) use of opiate analgesic: Secondary | ICD-10-CM | POA: Diagnosis not present

## 2019-12-17 DIAGNOSIS — K5150 Left sided colitis without complications: Secondary | ICD-10-CM

## 2019-12-17 DIAGNOSIS — F329 Major depressive disorder, single episode, unspecified: Secondary | ICD-10-CM | POA: Diagnosis present

## 2019-12-17 DIAGNOSIS — G4700 Insomnia, unspecified: Secondary | ICD-10-CM | POA: Diagnosis present

## 2019-12-17 DIAGNOSIS — F17210 Nicotine dependence, cigarettes, uncomplicated: Secondary | ICD-10-CM | POA: Diagnosis present

## 2019-12-17 DIAGNOSIS — I959 Hypotension, unspecified: Secondary | ICD-10-CM | POA: Diagnosis not present

## 2019-12-17 DIAGNOSIS — K2100 Gastro-esophageal reflux disease with esophagitis, without bleeding: Secondary | ICD-10-CM | POA: Diagnosis present

## 2019-12-17 DIAGNOSIS — R030 Elevated blood-pressure reading, without diagnosis of hypertension: Secondary | ICD-10-CM | POA: Diagnosis present

## 2019-12-17 DIAGNOSIS — K567 Ileus, unspecified: Secondary | ICD-10-CM | POA: Diagnosis present

## 2019-12-17 DIAGNOSIS — K449 Diaphragmatic hernia without obstruction or gangrene: Secondary | ICD-10-CM | POA: Diagnosis present

## 2019-12-17 DIAGNOSIS — F418 Other specified anxiety disorders: Secondary | ICD-10-CM | POA: Diagnosis not present

## 2019-12-17 DIAGNOSIS — Z716 Tobacco abuse counseling: Secondary | ICD-10-CM

## 2019-12-17 DIAGNOSIS — Z79899 Other long term (current) drug therapy: Secondary | ICD-10-CM | POA: Diagnosis not present

## 2019-12-17 DIAGNOSIS — K51518 Left sided colitis with other complication: Secondary | ICD-10-CM | POA: Diagnosis present

## 2019-12-17 DIAGNOSIS — K5909 Other constipation: Secondary | ICD-10-CM | POA: Diagnosis present

## 2019-12-17 DIAGNOSIS — R109 Unspecified abdominal pain: Secondary | ICD-10-CM | POA: Diagnosis not present

## 2019-12-17 DIAGNOSIS — R739 Hyperglycemia, unspecified: Secondary | ICD-10-CM | POA: Insufficient documentation

## 2019-12-17 DIAGNOSIS — I700 Atherosclerosis of aorta: Secondary | ICD-10-CM | POA: Diagnosis present

## 2019-12-17 DIAGNOSIS — Z90710 Acquired absence of both cervix and uterus: Secondary | ICD-10-CM | POA: Diagnosis not present

## 2019-12-17 DIAGNOSIS — Z7289 Other problems related to lifestyle: Secondary | ICD-10-CM | POA: Diagnosis not present

## 2019-12-17 DIAGNOSIS — R0600 Dyspnea, unspecified: Secondary | ICD-10-CM | POA: Diagnosis not present

## 2019-12-17 LAB — CBC
HCT: 40.5 % (ref 36.0–46.0)
Hemoglobin: 13.8 g/dL (ref 12.0–15.0)
MCH: 29.4 pg (ref 26.0–34.0)
MCHC: 34.1 g/dL (ref 30.0–36.0)
MCV: 86.2 fL (ref 80.0–100.0)
Platelets: 292 10*3/uL (ref 150–400)
RBC: 4.7 MIL/uL (ref 3.87–5.11)
RDW: 12.7 % (ref 11.5–15.5)
WBC: 20.5 10*3/uL — ABNORMAL HIGH (ref 4.0–10.5)
nRBC: 0 % (ref 0.0–0.2)

## 2019-12-17 LAB — COMPREHENSIVE METABOLIC PANEL
ALT: 22 U/L (ref 0–44)
AST: 37 U/L (ref 15–41)
Albumin: 4.2 g/dL (ref 3.5–5.0)
Alkaline Phosphatase: 92 U/L (ref 38–126)
Anion gap: 11 (ref 5–15)
BUN: 14 mg/dL (ref 8–23)
CO2: 22 mmol/L (ref 22–32)
Calcium: 9.5 mg/dL (ref 8.9–10.3)
Chloride: 106 mmol/L (ref 98–111)
Creatinine, Ser: 0.87 mg/dL (ref 0.44–1.00)
GFR calc Af Amer: >60 mL/min (ref >60)
GFR calc non Af Amer: >60 mL/min (ref >60)
Glucose, Bld: 176 mg/dL — ABNORMAL HIGH (ref 70–99)
Potassium: 3.6 mmol/L (ref 3.5–5.1)
Sodium: 139 mmol/L (ref 135–145)
Total Bilirubin: 1.3 mg/dL — ABNORMAL HIGH (ref 0.3–1.2)
Total Protein: 7.4 g/dL (ref 6.5–8.1)

## 2019-12-17 LAB — HEMOGLOBIN A1C
Hgb A1c MFr Bld: 5.1 % (ref 4.8–5.6)
Mean Plasma Glucose: 99.67 mg/dL

## 2019-12-17 LAB — URINALYSIS, COMPLETE (UACMP) WITH MICROSCOPIC
Bacteria, UA: NONE SEEN
Bilirubin Urine: NEGATIVE
Glucose, UA: NEGATIVE mg/dL
Hgb urine dipstick: NEGATIVE
Ketones, ur: 20 mg/dL — AB
Leukocytes,Ua: NEGATIVE
Nitrite: NEGATIVE
Protein, ur: NEGATIVE mg/dL
Specific Gravity, Urine: 1.015 (ref 1.005–1.030)
pH: 8 (ref 5.0–8.0)

## 2019-12-17 LAB — POC SARS CORONAVIRUS 2 AG: SARS Coronavirus 2 Ag: NEGATIVE

## 2019-12-17 LAB — LIPASE, BLOOD: Lipase: 26 U/L (ref 11–51)

## 2019-12-17 LAB — LACTIC ACID, PLASMA: Lactic Acid, Venous: 2.9 mmol/L (ref 0.5–1.9)

## 2019-12-17 MED ORDER — ENOXAPARIN SODIUM 40 MG/0.4ML ~~LOC~~ SOLN
40.0000 mg | SUBCUTANEOUS | Status: DC
  Administered 2019-12-17 – 2019-12-23 (×6): 40 mg via SUBCUTANEOUS
  Filled 2019-12-17 (×6): qty 0.4

## 2019-12-17 MED ORDER — LACTATED RINGERS IV SOLN: INTRAVENOUS | Status: DC

## 2019-12-17 MED ORDER — METRONIDAZOLE IN NACL 5-0.79 MG/ML-% IV SOLN
500.0000 mg | Freq: Three times a day (TID) | INTRAVENOUS | Status: DC
  Administered 2019-12-17: 500 mg via INTRAVENOUS
  Filled 2019-12-17 (×2): qty 100

## 2019-12-17 MED ORDER — CIPROFLOXACIN IN D5W 400 MG/200ML IV SOLN
400.0000 mg | Freq: Once | INTRAVENOUS | Status: AC
  Administered 2019-12-17: 400 mg via INTRAVENOUS
  Filled 2019-12-17: qty 200

## 2019-12-17 MED ORDER — ONDANSETRON HCL 4 MG/2ML IJ SOLN
4.0000 mg | Freq: Once | INTRAMUSCULAR | Status: AC
  Administered 2019-12-17: 4 mg via INTRAVENOUS

## 2019-12-17 MED ORDER — METRONIDAZOLE IN NACL 5-0.79 MG/ML-% IV SOLN
500.0000 mg | Freq: Once | INTRAVENOUS | Status: AC
  Administered 2019-12-17: 500 mg via INTRAVENOUS
  Filled 2019-12-17: qty 100

## 2019-12-17 MED ORDER — PROMETHAZINE HCL 25 MG/ML IJ SOLN
12.5000 mg | Freq: Four times a day (QID) | INTRAMUSCULAR | Status: DC | PRN
  Administered 2019-12-17 – 2019-12-20 (×7): 12.5 mg via INTRAVENOUS
  Filled 2019-12-17 (×7): qty 1

## 2019-12-17 MED ORDER — ACETAMINOPHEN 325 MG PO TABS
650.0000 mg | ORAL_TABLET | Freq: Four times a day (QID) | ORAL | Status: DC | PRN
  Administered 2019-12-17: 650 mg via ORAL
  Filled 2019-12-17: qty 2

## 2019-12-17 MED ORDER — KETOROLAC TROMETHAMINE 30 MG/ML IJ SOLN
30.0000 mg | Freq: Four times a day (QID) | INTRAMUSCULAR | Status: AC | PRN
  Administered 2019-12-18 – 2019-12-20 (×3): 30 mg via INTRAVENOUS
  Filled 2019-12-17 (×3): qty 1

## 2019-12-17 MED ORDER — TRAMADOL HCL 50 MG PO TABS
50.0000 mg | ORAL_TABLET | Freq: Four times a day (QID) | ORAL | Status: DC | PRN
  Administered 2019-12-17: 50 mg via ORAL
  Filled 2019-12-17: qty 1

## 2019-12-17 MED ORDER — IOHEXOL 300 MG/ML  SOLN
100.0000 mL | Freq: Once | INTRAMUSCULAR | Status: AC | PRN
  Administered 2019-12-17: 100 mL via INTRAVENOUS
  Filled 2019-12-17: qty 100

## 2019-12-17 MED ORDER — ONDANSETRON HCL 4 MG/2ML IJ SOLN
4.0000 mg | Freq: Four times a day (QID) | INTRAMUSCULAR | Status: DC | PRN
  Administered 2019-12-17 – 2019-12-18 (×2): 4 mg via INTRAVENOUS
  Filled 2019-12-17 (×3): qty 2

## 2019-12-17 MED ORDER — METRONIDAZOLE IN NACL 5-0.79 MG/ML-% IV SOLN: 500.0000 mg | Freq: Three times a day (TID) | INTRAVENOUS | Status: DC

## 2019-12-17 MED ORDER — CIPROFLOXACIN IN D5W 400 MG/200ML IV SOLN
400.0000 mg | Freq: Two times a day (BID) | INTRAVENOUS | Status: DC
  Administered 2019-12-18: 07:00:00 400 mg via INTRAVENOUS
  Filled 2019-12-17: qty 200

## 2019-12-17 MED ORDER — ONDANSETRON HCL 4 MG PO TABS: 4.0000 mg | ORAL_TABLET | Freq: Four times a day (QID) | ORAL | Status: DC | PRN

## 2019-12-17 MED ORDER — SODIUM CHLORIDE 0.9 % IV BOLUS
1000.0000 mL | Freq: Once | INTRAVENOUS | Status: AC
  Administered 2019-12-17: 1000 mL via INTRAVENOUS

## 2019-12-17 MED ORDER — MORPHINE SULFATE (PF) 4 MG/ML IV SOLN
4.0000 mg | INTRAVENOUS | Status: DC | PRN
  Administered 2019-12-17 – 2019-12-19 (×4): 4 mg via INTRAVENOUS
  Filled 2019-12-17 (×4): qty 1

## 2019-12-17 MED ORDER — DIPHENHYDRAMINE HCL 50 MG/ML IJ SOLN
12.5000 mg | Freq: Once | INTRAMUSCULAR | Status: AC
  Administered 2019-12-17: 12.5 mg via INTRAVENOUS
  Filled 2019-12-17: qty 1

## 2019-12-17 MED ORDER — ACETAMINOPHEN 650 MG RE SUPP: 650.0000 mg | Freq: Four times a day (QID) | RECTAL | Status: DC | PRN

## 2019-12-17 NOTE — ED Triage Notes (Signed)
Pt in via [facility] with complaints sudden onset N/V since 0100 today, some abdominal cramping as well.  Medications given prior to arrival; see First Nurse Note.  Pt appears uncomfortable.

## 2019-12-17 NOTE — ED Notes (Signed)
EKG obtained by this RN, POC SARS test initiated by this RN at this time.

## 2019-12-17 NOTE — ED Notes (Signed)
Date and time results received: 12/17/19 5:01 PM  (use smartphrase ".now" to insert current time)  Test: Lactic Critical Value: 2.9  Name of Provider Notified: Posey Pronto  Orders Received? Or Actions Taken?: Notified via secure chat, awaiting orders

## 2019-12-17 NOTE — ED Triage Notes (Signed)
Patient c/o nausea, vomiting and abdominal pain that started at 1:30am this morning. She states she took 2 laxatives yesterday afternoon around 2:00pm. She states when she stood up yesterday she had an episode of fecal incontinence so she took 2 laxatives.

## 2019-12-17 NOTE — Consult Note (Signed)
Pharmacy Antibiotic Note  Aimee Stuart is a 64 y.o. female admitted on 12/17/2019 with colitis.  Pharmacy has been consulted for ciprofloxacin dosing. Her renal function appears to be at baseline and un-impaired  Plan:       Start ciprofloxacin 400 mg IV every 12 hours  Height: 5' 8"  (172.7 cm) Weight: 158 lb (71.7 kg) IBW/kg (Calculated) : 63.9  Temp (24hrs), Avg:97.8 F (36.6 C), Min:97.7 F (36.5 C), Max:97.9 F (36.6 C)  Recent Labs  Lab 12/11/19 0857 12/17/19 1309  WBC 6.8 20.5*  CREATININE 0.94 0.87    Estimated Creatinine Clearance: 66.8 mL/min (by C-G formula based on SCr of 0.87 mg/dL).    No Known Allergies  Antimicrobials this admission: metronidazole 3/15 >>  ciprofloxacin 3/15 >>   Microbiology results: 3/15 UCx: pending  3/15 SARS CoV-2: pending  3/15 C diff: pending 3/15 GI panel: pending  Thank you for allowing pharmacy to be a part of this patient's care.  Dallie Piles 12/17/2019 4:49 PM

## 2019-12-17 NOTE — H&P (Addendum)
[facility] [place] NAME: Aimee Stuart    MR#:  [id number]  DATE OF BIRTH:  [birth year]  DATE OF ADMISSION:  12/17/2019  PRIMARY CARE PHYSICIAN: [name], MD   REQUESTING/REFERRING PHYSICIAN: Dr. Merlyn Lot  Patient coming from : home patient is independent with her ADLs. Lives at home with her husband.  CHIEF COMPLAINT:  nausea vomiting and left lower quadrant abdominal pain started at 1 AM  patient just got morphine and Phenergan prior to my seeing her. She is somewhat sleepy although tried to answer most of my questions discussed with the ER physician also  HISTORY OF PRESENT ILLNESS:  Aimee Stuart  is a 64 y.o. female with a known history of anxiety, depression, history of insomnia comes to the emergency room with acute onset severe nausea vomiting and left lower quadrant abdominal pain -patient denies having any kind of the symptoms before. She has bowel movement on a regular basis. Woke up around 1 AM this morning with above symptoms. ED course: patient currently sleepy from morphine and Phenergan given however did answer most of my questions appropriately. patient is afebrile pulses 84 blood pressure is 168/91 during my evaluation. She had earlier high blood pressure 171/103. she does not take any antihypertensives. white count is 20.3. CT scan of the abdomen shows left lower quadrant acute colitis. patient started on IV Cipro and Flagyl. Given her symptoms of nausea vomiting unable to keep anything PO and elevated white count with abnormal CT scan results will admit her for further evaluation management. PAST MEDICAL HISTORY:   Past Medical History:  Diagnosis Date  . Acute renal failure (HCC)    due to bladder damage during hysterectomy  . Insomnia   . Wears contact lenses    sometimes    PAST SURGICAL HISTOIRY:   Past Surgical History:  Procedure Laterality Date  . ABDOMINAL HYSTERECTOMY  2013   [facility]   . BLADDER REPAIR     damaged during hysterectomy  . CERVICAL BIOPSY  W/ LOOP ELECTRODE EXCISION  07/2008  . CESAREAN SECTION    . COLONOSCOPY WITH PROPOFOL N/A 12/20/2016   Procedure: COLONOSCOPY WITH PROPOFOL;  Surgeon: [name], MD;  Location: [facility];  Service: Endoscopy;  Laterality: N/A;  . COLPOSCOPY  2009  . KNEE SURGERY Right    age 75    SOCIAL HISTORY:   Social History   Tobacco Use  . Smoking status: Current Every Day Smoker    Packs/day: 0.50    Types: Cigarettes  . Smokeless tobacco: Never Used  Substance Use Topics  . Alcohol use: Yes    Alcohol/week: 0.0 standard drinks    FAMILY HISTORY:   Family History  Problem Relation Age of Onset  . Alzheimer's disease Mother   . Cancer Father 40       prostate  . Heart disease Father     DRUG ALLERGIES:  No Known Allergies  REVIEW OF SYSTEMS:  Review of Systems  Constitutional: Negative for chills, fever and weight loss.  HENT: Negative for ear discharge, ear pain and nosebleeds.   Eyes: Negative for blurred vision, pain and discharge.  Respiratory: Negative for sputum production, shortness of breath, wheezing and stridor.   Cardiovascular: Negative for chest pain, palpitations, orthopnea and PND.  Gastrointestinal: Positive for abdominal pain, nausea and vomiting. Negative for diarrhea.  Genitourinary: Negative for frequency and urgency.  Musculoskeletal: Negative for back pain and joint pain.  Neurological: Positive for weakness.  Negative for sensory change, speech change and focal weakness.  Psychiatric/Behavioral: Negative for depression and hallucinations. The patient is not nervous/anxious.      MEDICATIONS AT HOME:   Prior to Admission medications   Medication Sig Start Date End Date Taking? Authorizing Provider  buPROPion (WELLBUTRIN XL) 150 MG 24 hr tablet Take 150 mg by mouth daily. 11/17/19  Yes [provider]  clonazePAM (KLONOPIN) 0.5 MG tablet Take 0.25 mg by mouth  at bedtime as needed for anxiety.   Yes [provider]  fluticasone (FLONASE) 50 MCG/ACT nasal spray Place 1-2 sprays into both nostrils daily as needed for allergies or rhinitis.   Yes [provider]  Multiple Vitamin (MULTIVITAMIN WITH MINERALS) TABS tablet Take 1 tablet by mouth daily.   Yes [provider]  traZODone (DESYREL) 100 MG tablet TAKE 1 TABLET BY MOUTH EVERYDAY AT BEDTIME Patient taking differently: Take 100 mg by mouth at bedtime.  07/01/18  Yes [name], MD      VITAL SIGNS:  Blood pressure (!) 142/116, pulse 84, temperature 97.9 F (36.6 C), temperature source Oral, resp. rate 19, height 5' 8"  (1.727 m), weight 71.7 kg, SpO2 100 %.  PHYSICAL EXAMINATION:  GENERAL:  64 y.o.-year-old patient lying in the bed with mild  acute distress due to abdominal pain and vomiting.  EYES: Pupils equal, round, reactive to light and accommodation. No scleral icterus.  HEENT: Head atraumatic, normocephalic. Oropharynx and nasopharynx clear.  NECK:  Supple, no jugular venous distention. No thyroid enlargement, no tenderness.  LUNGS: Normal breath sounds bilaterally, no wheezing, rales,rhonchi or crepitation. No use of accessory muscles of respiration.  CARDIOVASCULAR: S1, S2 normal. No murmurs, rubs, or gallops.  ABDOMEN: Soft, nontender, nondistended. Bowel sounds present. No organomegaly or mass.  EXTREMITIES: No pedal edema, cyanosis, or clubbing.  NEUROLOGIC: Cranial nerves II through XII are intact. Muscle strength 5/5 in all extremities. Sensation intact. Gait not checked. Moves all extremities well. She is sleepy however overall stable answers most questions appropriately PSYCHIATRIC: sleepy from the meds SKIN: No obvious rash, lesion, or ulcer.   LABORATORY PANEL:   CBC Recent Labs  Lab 12/17/19 1309  WBC 20.5*  HGB 13.8  HCT 40.5  PLT 292    ------------------------------------------------------------------------------------------------------------------  Chemistries  Recent Labs  Lab 12/17/19 1309  NA 139  K 3.6  CL 106  CO2 22  GLUCOSE 176*  BUN 14  CREATININE 0.87  CALCIUM 9.5  AST 37  ALT 22  ALKPHOS 92  BILITOT 1.3*   ------------------------------------------------------------------------------------------------------------------  Cardiac Enzymes No results for input(s): TROPONINI in the last 168 hours. ------------------------------------------------------------------------------------------------------------------  RADIOLOGY:  CT ABDOMEN PELVIS W CONTRAST  Result Date: 12/17/2019 CLINICAL DATA:  64 year old female with history of nausea and vomiting. Suspected bowel obstruction. EXAM: CT ABDOMEN AND PELVIS WITH CONTRAST TECHNIQUE: Multidetector CT imaging of the abdomen and pelvis was performed using the standard protocol following bolus administration of intravenous contrast. CONTRAST:  145m OMNIPAQUE IOHEXOL 300 MG/ML  SOLN COMPARISON:  CT the abdomen and pelvis 04/26/2012. FINDINGS: Lower chest: Mild nodular scarring in the inferior segment of the lingula. Small hiatal hernia. Hepatobiliary: No suspicious cystic or solid hepatic lesions. No intra or extrahepatic biliary ductal dilatation. Gallbladder is normal in appearance. Pancreas: No pancreatic mass. No pancreatic ductal dilatation. No pancreatic or peripancreatic fluid collections or inflammatory changes. Spleen: Unremarkable. Adrenals/Urinary Tract: Bilateral kidneys and bilateral adrenal glands are normal in appearance. No hydroureteronephrosis. Urinary bladder is normal in appearance. Stomach/Bowel: Intra-abdominal portion of the stomach  is normal. No pathologic dilatation of small bowel. There is some mural thickening in the colon involving predominantly the descending colon where there are subtle surrounding inflammatory changes and a trace volume of  fluid in the left pericolic gutter, concerning for colitis. Mild gaseous distension is noted throughout much of the colon, particularly proximal to the descending colon. Normal appendix. Vascular/Lymphatic: Aortic atherosclerosis, without evidence of aneurysm or dissection in the abdominal or pelvic vasculature. No lymphadenopathy noted in the abdomen or pelvis. Reproductive: Status post hysterectomy. Ovaries are not confidently identified may be surgically absent or atrophic Other: Trace volume of fluid in the left pericolic gutter. No larger volume of ascites. No pneumoperitoneum. Musculoskeletal: There are no aggressive appearing lytic or blastic lesions noted in the visualized portions of the skeleton. IMPRESSION: 1. Mural thickening and inflammation associated with the descending colon, compatible with acute colitis. 2. Aortic atherosclerosis. 3. Additional incidental findings, as above. Electronically Signed   By: Vinnie Langton M.D.   On: 12/17/2019 16:03    EKG:    IMPRESSION AND PLAN:   Aimee Stuart  is a 64 y.o. female with a known history of anxiety, depression, history of insomnia comes to the emergency room with acute onset severe nausea vomiting and left lower quadrant abdominal pain  1. SIRS due to Acute colitis, left lower quadrant--POA -patient presented with nausea vomiting left lower quadrant abdominal pain starting early hours of the morning -elevated white count of 20.3 -BP 171/103, RR 24 - LA 2.9--repeat in am -CT abdomen suggestive of acute colitis, descending Colon -clear liquid diet -IV fluids -IV Cipro and Flagyl-- pharmacy consult placed -PRN Zofran  -IV PO and PRN pain meds -consider G.I. consultation if needed  2. Leukocytosis due to number one -continue to monitor  3. History of depression/anxiety -Wellbutrin and Klonopin-- resume once she is more awake  4. History of smoking -since patient was quite sleepy I was not able to discuss smoking cessation  counseling  5. H/o Insomnia -takes trazodone qhs  6. DVT prophylaxis subcu Lovenox  7. Elevated BP w/o h/o HTN--likely due to pain with N/V -prn IV hydralazine -consider antihypertensives if BP remains elevated  8.Hyperglycemia--No h/o DM-2 - check A1c   Family Communication :Husband/ dter on the phone Consults : Code Status :FULL code--will need to be readdressed when more awake DVT prophylaxis :lovenox  TOTAL TIME TAKING CARE OF THIS PATIENT: *50* minutes.    Fritzi Mandes M.D  [facility]     CC: Primary care physician; [name], MD

## 2019-12-17 NOTE — ED Provider Notes (Signed)
[facility]    CSN: [id number] Arrival date & time: 12/17/19  1140  History   Chief Complaint Chief Complaint  Patient presents with  . Abdominal Pain   HPI  64 year old female presents with sudden onset nausea, vomiting, and abdominal pain.  Patient brought in by her husband.  She reports that her symptoms started last night/early this morning around 1 AM.  Patient reports severe nausea, and persistent vomiting.  Associated abdominal pain.  She has had some fecal incontinence as well.  No documented fever.  Husband states that she was around some other individuals.  He is concerned that she may have Covid.  No relieving factors.  No other reported symptoms.  No other complaints.  Of note, patient is ill-appearing and lethargic.  Past Medical History:  Diagnosis Date  . Acute renal failure (HCC)    due to bladder damage during hysterectomy  . Insomnia   . Wears contact lenses    sometimes    Patient Active Problem List   Diagnosis Date Noted  . Hx of colonic polyps     Past Surgical History:  Procedure Laterality Date  . ABDOMINAL HYSTERECTOMY  2013   [facility]  . BLADDER REPAIR     damaged during hysterectomy  . CERVICAL BIOPSY  W/ LOOP ELECTRODE EXCISION  07/2008  . CESAREAN SECTION    . COLONOSCOPY WITH PROPOFOL N/A 12/20/2016   Procedure: COLONOSCOPY WITH PROPOFOL;  Surgeon: [name], MD;  Location: [place];  Service: Endoscopy;  Laterality: N/A;  . COLPOSCOPY  2009  . KNEE SURGERY Right    age 81    OB History    Gravida  2   Para  2   Term      Preterm      AB      Living  2     SAB      TAB      Ectopic      Multiple      Live Births               Home Medications    Prior to Admission medications   Medication Sig Start Date End Date Taking? Authorizing Provider  ALPRAZolam (XANAX) 0.5 MG tablet TAKE 1 TABLET BY MOUTH TWICE A DAY AS NEEDED FOR ANXIETY 01/22/16   [provider]  buPROPion  (WELLBUTRIN SR) 150 MG 12 hr tablet Take 1 tablet (150 mg total) by mouth daily. 02/22/18   McManama, Franchot Mimes, FNP  clonazePAM (KLONOPIN) 0.5 MG tablet Take 1 tablet (0.5 mg total) by mouth 2 (two) times daily as needed for anxiety. 02/28/18   [name], MD  fluticasone (FLONASE) 50 MCG/ACT nasal spray Place 2 sprays into both nostrils daily. 09/03/15   Fisher, Linden Dolin, PA-C  traZODone (DESYREL) 100 MG tablet TAKE 1 TABLET BY MOUTH EVERYDAY AT BEDTIME 07/01/18   [name], MD  varenicline (CHANTIX CONTINUING MONTH PAK) 1 MG tablet Take 1 tablet (1 mg total) by mouth 2 (two) times daily. Start after finishing the starter month pack 05/28/19   [name], MD  varenicline (CHANTIX STARTING MONTH PAK) 0.5 MG X 11 & 1 MG X 42 tablet Take one 0.5 mg tablet by mouth once daily for 3 days, then increase to one 0.5 mg tablet twice daily for 4 days, then increase to one 1 mg tablet twice daily. 05/28/19   [name], MD    Family History Family History  Problem Relation Age of  Onset  . Alzheimer's disease Mother   . Cancer Father 66       prostate  . Heart disease Father     Social History Social History   Tobacco Use  . Smoking status: Former Smoker    Types: Cigarettes    Quit date: 10/06/2015    Years since quitting: 4.2  . Smokeless tobacco: Never Used  Substance Use Topics  . Alcohol use: Yes    Alcohol/week: 0.0 standard drinks    Comment: 1-2 drinks/ month  . Drug use: No     Allergies   Patient has no known allergies.   Review of Systems Review of Systems  Constitutional: Positive for appetite change.  Gastrointestinal: Positive for abdominal pain, nausea and vomiting.   Physical Exam Triage Vital Signs ED Triage Vitals  Enc Vitals Group     BP 12/17/19 1153 (!) 171/103     Pulse Rate 12/17/19 1153 74     Resp 12/17/19 1153 (!) 24     Temp 12/17/19 1153 97.7 F (36.5 C)     Temp Source 12/17/19 1153 Oral     SpO2 12/17/19 1153 100 %      Weight --      Height --      Head Circumference --      Peak Flow --      Pain Score 12/17/19 1223 8     Pain Loc --      Pain Edu? --      Excl. in [place]? --    Updated Vital Signs BP (!) 171/103 (BP Location: Right Arm)   Pulse 74   Temp 97.7 F (36.5 C) (Oral)   Resp (!) 24   SpO2 100%   Visual Acuity Right Eye Distance:   Left Eye Distance:   Bilateral Distance:    Right Eye Near:   Left Eye Near:    Bilateral Near:     Physical Exam Constitutional:      Appearance: She is ill-appearing.     Comments: Patient responds to questions but her eyes remain closed.   HENT:     Head: Normocephalic and atraumatic.     Nose: Nose normal.  Cardiovascular:     Rate and Rhythm: Normal rate and regular rhythm.     Heart sounds: No murmur.  Pulmonary:     Breath sounds: No wheezing or rales.     Comments: Tachypneic. Abdominal:     General: There is no distension.     Palpations: Abdomen is soft.     Tenderness: There is no guarding or rebound.     Comments: Appears to be tender in the right upper quadrant.  Skin:    General: Skin is warm.     Findings: No rash.  Neurological:     Comments: No apparent focal deficits.    UC Treatments / Results  Labs (all labs ordered are listed, but only abnormal results are displayed) Labs Reviewed - No data to display  EKG   Radiology No results found.  Procedures Procedures (including critical care time)  Medications Ordered in UC Medications  ondansetron (ZOFRAN) injection 4 mg (4 mg Intravenous Given 12/17/19 1206)  sodium chloride 0.9 % bolus 1,000 mL (1,000 mLs Intravenous New Bag/Given 12/17/19 1206)    Initial Impression / Assessment and Plan / UC Course  I have reviewed the triage vital signs and the nursing notes.  Pertinent labs & imaging results that were available during my care of the  patient were reviewed by me and considered in my medical decision making (see chart for details).    64 year old female  presents in acute distress secondary to intractable nausea, vomiting, and abdominal pain.  Patient is clinically ill-appearing.  IV was placed.  Zofran was given for nausea.  Patient was transported emergently to the ER for further evaluation and management.  Nurse notified at [facility].  Final Clinical Impressions(s) / UC Diagnoses   Final diagnoses:  Intractable nausea and vomiting  Abdominal pain, unspecified abdominal location   Discharge Instructions   None    ED Prescriptions    None     PDMP not reviewed this encounter.   Coral Spikes, [place] 12/17/19 1247

## 2019-12-17 NOTE — ED Provider Notes (Signed)
[facility] Emergency Department Provider Note    First MD Initiated Contact with Patient 12/17/19 1528     (approximate)  I have reviewed the triage vital signs and the nursing notes.   HISTORY  Chief Complaint Emesis    HPI Aimee Stuart is a 64 y.o. female plosive past medical history presents to the ER for less than 24 hours of severe nausea vomiting and left lower quadrant abdominal pain.  Is never had symptoms like this before.  Not currently on any antibiotics.  States that she was feeling like she was constipated and did take a laxative.  That has not helped.  Denies any blood in her bowel movements.  No bloody emesis.  Denies any chest pain or shortness of breath.    Past Medical History:  Diagnosis Date  . Acute renal failure (HCC)    due to bladder damage during hysterectomy  . Insomnia   . Wears contact lenses    sometimes   Family History  Problem Relation Age of Onset  . Alzheimer's disease Mother   . Cancer Father 85       prostate  . Heart disease Father    Past Surgical History:  Procedure Laterality Date  . ABDOMINAL HYSTERECTOMY  2013   [facility]  . BLADDER REPAIR     damaged during hysterectomy  . CERVICAL BIOPSY  W/ LOOP ELECTRODE EXCISION  07/2008  . CESAREAN SECTION    . COLONOSCOPY WITH PROPOFOL N/A 12/20/2016   Procedure: COLONOSCOPY WITH PROPOFOL;  Surgeon: [name], MD;  Location: [facility];  Service: Endoscopy;  Laterality: N/A;  . COLPOSCOPY  2009  . KNEE SURGERY Right    age 19   Patient Active Problem List   Diagnosis Date Noted  . Hx of colonic polyps       Prior to Admission medications   Medication Sig Start Date End Date Taking? Authorizing Provider  ALPRAZolam (XANAX) 0.5 MG tablet TAKE 1 TABLET BY MOUTH TWICE A DAY AS NEEDED FOR ANXIETY 01/22/16   [provider]  buPROPion (WELLBUTRIN SR) 150 MG 12 hr tablet Take 1 tablet (150 mg total) by mouth daily. 02/22/18   McManama,  Franchot Mimes, FNP  clonazePAM (KLONOPIN) 0.5 MG tablet Take 1 tablet (0.5 mg total) by mouth 2 (two) times daily as needed for anxiety. 02/28/18   [name], MD  fluticasone (FLONASE) 50 MCG/ACT nasal spray Place 2 sprays into both nostrils daily. 09/03/15   Fisher, Linden Dolin, PA-C  traZODone (DESYREL) 100 MG tablet TAKE 1 TABLET BY MOUTH EVERYDAY AT BEDTIME 07/01/18   [name], MD  varenicline (CHANTIX CONTINUING MONTH PAK) 1 MG tablet Take 1 tablet (1 mg total) by mouth 2 (two) times daily. Start after finishing the starter month pack 05/28/19   [name], MD  varenicline (CHANTIX STARTING MONTH PAK) 0.5 MG X 11 & 1 MG X 42 tablet Take one 0.5 mg tablet by mouth once daily for 3 days, then increase to one 0.5 mg tablet twice daily for 4 days, then increase to one 1 mg tablet twice daily. 05/28/19   [name], MD    Allergies Patient has no known allergies.    Social History Social History   Tobacco Use  . Smoking status: Current Every Day Smoker    Packs/day: 0.50    Types: Cigarettes  . Smokeless tobacco: Never Used  Substance Use Topics  . Alcohol use: Yes    Alcohol/week: 0.0 standard  drinks  . Drug use: No    Review of Systems Patient denies headaches, rhinorrhea, blurry vision, numbness, shortness of breath, chest pain, edema, cough, abdominal pain, nausea, vomiting, diarrhea, dysuria, fevers, rashes or hallucinations unless otherwise stated above in HPI. ____________________________________________   PHYSICAL EXAM:  VITAL SIGNS: Vitals:   12/17/19 1256  BP: (!) 142/116  Pulse: 84  Resp: 19  Temp: 97.9 F (36.6 C)  SpO2: 100%    Constitutional: Alert and oriented.  Eyes: Conjunctivae are normal.  Head: Atraumatic. Nose: No congestion/rhinnorhea. Mouth/Throat: Mucous membranes are moist.   Neck: No stridor. Painless ROM.  Cardiovascular: Normal rate, regular rhythm. Grossly normal heart sounds.  Good peripheral  circulation. Respiratory: Normal respiratory effort.  No retractions. Lungs CTAB. Gastrointestinal: Soft WITH TTP IN llq.. No distention. No abdominal bruits. No CVA tenderness. Genitourinary:  Musculoskeletal: No lower extremity tenderness nor edema.  No joint effusions. Neurologic:  Normal speech and language. No gross focal neurologic deficits are appreciated. No facial droop Skin:  Skin is warm, dry and intact. No rash noted. Psychiatric: Mood and affect are normal. Speech and behavior are normal.  ____________________________________________   LABS (all labs ordered are listed, but only abnormal results are displayed)  Results for orders placed or performed during the hospital encounter of 12/17/19 (from the past 24 hour(s))  Lipase, blood     Status: None   Collection Time: 12/17/19  1:09 PM  Result Value Ref Range   Lipase 26 11 - 51 U/L  Comprehensive metabolic panel     Status: Abnormal   Collection Time: 12/17/19  1:09 PM  Result Value Ref Range   Sodium 139 135 - 145 mmol/L   Potassium 3.6 3.5 - 5.1 mmol/L   Chloride 106 98 - 111 mmol/L   CO2 22 22 - 32 mmol/L   Glucose, Bld 176 (H) 70 - 99 mg/dL   BUN 14 8 - 23 mg/dL   Creatinine, Ser 0.87 0.44 - 1.00 mg/dL   Calcium 9.5 8.9 - 10.3 mg/dL   Total Protein 7.4 6.5 - 8.1 g/dL   Albumin 4.2 3.5 - 5.0 g/dL   AST 37 15 - 41 U/L   ALT 22 0 - 44 U/L   Alkaline Phosphatase 92 38 - 126 U/L   Total Bilirubin 1.3 (H) 0.3 - 1.2 mg/dL   GFR calc non Af Amer >60 >60 mL/min   GFR calc Af Amer >60 >60 mL/min   Anion gap 11 5 - 15  CBC     Status: Abnormal   Collection Time: 12/17/19  1:09 PM  Result Value Ref Range   WBC 20.5 (H) 4.0 - 10.5 K/uL   RBC 4.70 3.87 - 5.11 MIL/uL   Hemoglobin 13.8 12.0 - 15.0 g/dL   HCT 40.5 36.0 - 46.0 %   MCV 86.2 80.0 - 100.0 fL   MCH 29.4 26.0 - 34.0 pg   MCHC 34.1 30.0 - 36.0 g/dL   RDW 12.7 11.5 - 15.5 %   Platelets 292 150 - 400 K/uL   nRBC 0.0 0.0 - 0.2 %  Urinalysis, Complete w  Microscopic     Status: Abnormal   Collection Time: 12/17/19  1:09 PM  Result Value Ref Range   Color, Urine AMBER (A) YELLOW   APPearance CLEAR (A) CLEAR   Specific Gravity, Urine 1.015 1.005 - 1.030   pH 8.0 5.0 - 8.0   Glucose, UA NEGATIVE NEGATIVE mg/dL   Hgb urine dipstick NEGATIVE NEGATIVE   Bilirubin Urine  NEGATIVE NEGATIVE   Ketones, ur 20 (A) NEGATIVE mg/dL   Protein, ur NEGATIVE NEGATIVE mg/dL   Nitrite NEGATIVE NEGATIVE   Leukocytes,Ua NEGATIVE NEGATIVE   WBC, UA 0-5 0 - 5 WBC/hpf   Bacteria, UA NONE SEEN NONE SEEN   Squamous Epithelial / LPF 0-5 0 - 5   Mucus PRESENT    ____________________________________________  EKG My review and personal interpretation at Time: 16:02   Indication: abd pain  Rate: 85  Rhythm: sinus Axis: normal Other: normal intervals, nonsepcific st abn, no stemi ____________________________________________  RADIOLOGY  I personally reviewed all radiographic images ordered to evaluate for the above acute complaints and reviewed radiology reports and findings.  These findings were personally discussed with the patient.  Please see medical record for radiology report.  ____________________________________________   PROCEDURES  Procedure(s) performed:  Procedures    Critical Care performed: no ____________________________________________   INITIAL IMPRESSION / ASSESSMENT AND PLAN / ED COURSE  Pertinent labs & imaging results that were available during my care of the patient were reviewed by me and considered in my medical decision making (see chart for details).   DDX: Diverticulitis, colitis, SBO, perforation, physical strain, mass, stone, cystitis  TYJAH HAI is a 64 y.o. who presents to the ED with symptoms as described above.  Blood work shows significant leukocytosis but she is afebrile no tachycardia.  CT imaging was ordered given her white count and left lower quadrant abdominal pain which shows evidence of acute colitis.   Given her age no appearance will okay with IV fluid as well as IV antibiotics.  Will discuss with hospitalist for admission.  Have discussed with the patient and available family all diagnostics and treatments performed thus far and all questions were answered to the best of my ability. The patient demonstrates understanding and agreement with plan.      The patient was evaluated in Emergency Department today for the symptoms described in the history of present illness. He/she was evaluated in the context of the global COVID-19 pandemic, which necessitated consideration that the patient might be at risk for infection with the SARS-CoV-2 virus that causes COVID-19. Institutional protocols and algorithms that pertain to the evaluation of patients at risk for COVID-19 are in a state of rapid change based on information released by regulatory bodies including the CDC and federal and state organizations. These policies and algorithms were followed during the patient's care in the ED.  As part of my medical decision making, I reviewed the following data within the [place] notes reviewed and incorporated, Labs reviewed, notes from prior ED visits and [place] Controlled Substance Database   ____________________________________________   FINAL CLINICAL IMPRESSION(S) / ED DIAGNOSES  Final diagnoses:  Acute colitis      NEW MEDICATIONS STARTED DURING THIS VISIT:  New Prescriptions   No medications on file     Note:  This document was prepared using Dragon voice recognition software and may include unintentional dictation errors.    Merlyn Lot, MD 12/17/19 [phone]

## 2019-12-17 NOTE — ED Triage Notes (Signed)
Pt comes into the ED via EMS from [facility] urgent care with c/o N/V since 1;30am, states she had some anal leakage yesterday and took 2 laxatives, pt received 39m NS, total of 858mof zofran, 2m63mt urgent care and 2mg86mom EMS.

## 2019-12-18 DIAGNOSIS — K529 Noninfective gastroenteritis and colitis, unspecified: Secondary | ICD-10-CM

## 2019-12-18 LAB — CBC
HCT: 38.8 % (ref 36.0–46.0)
Hemoglobin: 13.4 g/dL (ref 12.0–15.0)
MCH: 29.6 pg (ref 26.0–34.0)
MCHC: 34.5 g/dL (ref 30.0–36.0)
MCV: 85.8 fL (ref 80.0–100.0)
Platelets: 300 10*3/uL (ref 150–400)
RBC: 4.52 MIL/uL (ref 3.87–5.11)
RDW: 13.4 % (ref 11.5–15.5)
WBC: 32.8 10*3/uL — ABNORMAL HIGH (ref 4.0–10.5)
nRBC: 0 % (ref 0.0–0.2)

## 2019-12-18 LAB — SARS CORONAVIRUS 2 (TAT 6-24 HRS): SARS Coronavirus 2: NEGATIVE

## 2019-12-18 LAB — LACTIC ACID, PLASMA: Lactic Acid, Venous: 2.5 mmol/L (ref 0.5–1.9)

## 2019-12-18 MED ORDER — MINERAL OIL RE ENEM
1.0000 | ENEMA | Freq: Once | RECTAL | Status: AC
  Administered 2019-12-18: 1 via RECTAL

## 2019-12-18 MED ORDER — HYDRALAZINE HCL 20 MG/ML IJ SOLN
10.0000 mg | Freq: Four times a day (QID) | INTRAMUSCULAR | Status: DC | PRN
  Administered 2019-12-22: 10 mg via INTRAVENOUS
  Filled 2019-12-18 (×2): qty 0.5

## 2019-12-18 MED ORDER — PIPERACILLIN-TAZOBACTAM 3.375 G IVPB
3.3750 g | Freq: Three times a day (TID) | INTRAVENOUS | Status: DC
  Administered 2019-12-18 – 2019-12-22 (×12): 3.375 g via INTRAVENOUS
  Filled 2019-12-18 (×13): qty 50

## 2019-12-18 MED ORDER — ONDANSETRON HCL 4 MG PO TABS
4.0000 mg | ORAL_TABLET | ORAL | Status: DC | PRN
  Administered 2019-12-19 – 2019-12-23 (×4): 4 mg via ORAL
  Filled 2019-12-18 (×5): qty 1

## 2019-12-18 MED ORDER — ONDANSETRON HCL 4 MG/2ML IJ SOLN
4.0000 mg | Freq: Four times a day (QID) | INTRAMUSCULAR | Status: DC | PRN
  Administered 2019-12-18 – 2019-12-22 (×4): 4 mg via INTRAVENOUS
  Filled 2019-12-18 (×5): qty 2

## 2019-12-18 MED ORDER — METOCLOPRAMIDE HCL 5 MG/ML IJ SOLN
10.0000 mg | Freq: Once | INTRAMUSCULAR | Status: AC
  Administered 2019-12-18: 10 mg via INTRAVENOUS
  Filled 2019-12-18: qty 2

## 2019-12-18 MED ORDER — METOCLOPRAMIDE HCL 5 MG/ML IJ SOLN
5.0000 mg | Freq: Four times a day (QID) | INTRAMUSCULAR | Status: DC | PRN
  Administered 2019-12-18 – 2019-12-20 (×5): 5 mg via INTRAVENOUS
  Filled 2019-12-18 (×5): qty 2

## 2019-12-18 MED ORDER — MAGNESIUM CITRATE PO SOLN
1.0000 | Freq: Once | ORAL | Status: AC
  Administered 2019-12-18: 1 via ORAL
  Filled 2019-12-18: qty 296

## 2019-12-18 MED ORDER — METOCLOPRAMIDE HCL 5 MG/ML IJ SOLN
10.0000 mg | Freq: Once | INTRAMUSCULAR | Status: AC
  Administered 2019-12-18: 10:00:00 10 mg via INTRAVENOUS
  Filled 2019-12-18: qty 2

## 2019-12-18 MED ORDER — POLYETHYLENE GLYCOL 3350 17 G PO PACK
17.0000 g | PACK | Freq: Two times a day (BID) | ORAL | Status: DC
  Administered 2019-12-18 – 2019-12-20 (×4): 17 g via ORAL
  Filled 2019-12-18 (×4): qty 1

## 2019-12-18 NOTE — Progress Notes (Signed)
[place] [facility] [place] NAME: Aimee Stuart    MR#:  [id number]  DATE OF BIRTH:  1956/06/20  SUBJECTIVE:  continues to have dry heaving and intermittent small emesis. Has been feeling very uneasy. Abdominal pain slightly better. Fever of 100.8 REVIEW OF SYSTEMS:   Review of Systems  Constitutional: Positive for malaise/fatigue. Negative for chills, fever and weight loss.  HENT: Negative for ear discharge, ear pain and nosebleeds.   Eyes: Negative for blurred vision, pain and discharge.  Respiratory: Negative for sputum production, wheezing and stridor.   Cardiovascular: Negative for chest pain, palpitations, orthopnea and PND.  Gastrointestinal: Positive for abdominal pain, nausea and vomiting. Negative for diarrhea.  Genitourinary: Negative for frequency and urgency.  Musculoskeletal: Negative for back pain and joint pain.  Neurological: Positive for weakness. Negative for sensory change, speech change and focal weakness.  Psychiatric/Behavioral: Negative for depression and hallucinations. The patient is not nervous/anxious.    Tolerating Diet:npo Tolerating PT:   DRUG ALLERGIES:  No Known Allergies  VITALS:  Blood pressure (!) 154/81, pulse 96, temperature 98.4 F (36.9 C), resp. rate 20, height 5' 8"  (1.727 m), weight 71.7 kg, SpO2 100 %.  PHYSICAL EXAMINATION:   Physical Exam  GENERAL:  64 y.o.-year-old patient lying in the bed with mild to mod acute distress.  EYES: Pupils equal, round, reactive to light and accommodation. No scleral icterus.   HEENT: Head atraumatic, normocephalic. Oropharynx and nasopharynx clear.  NECK:  Supple, no jugular venous distention. No thyroid enlargement, no tenderness.  LUNGS: Normal breath sounds bilaterally, no wheezing, rales, rhonchi. No use of accessory muscles of respiration.  CARDIOVASCULAR: S1, S2 normal. No murmurs, rubs, or gallops.  ABDOMEN: Soft, let LQ tender, nondistended. Bowel  sounds present. No organomegaly or mass.  EXTREMITIES: No cyanosis, clubbing or edema b/l.    NEUROLOGIC: Cranial nerves II through XII are intact. No focal Motor or sensory deficits b/l.   PSYCHIATRIC:  patient is alert and oriented x 3.  SKIN: No obvious rash, lesion, or ulcer.   LABORATORY PANEL:  CBC Recent Labs  Lab 12/18/19 0404  WBC 32.8*  HGB 13.4  HCT 38.8  PLT 300    Chemistries  Recent Labs  Lab 12/17/19 1309  NA 139  K 3.6  CL 106  CO2 22  GLUCOSE 176*  BUN 14  CREATININE 0.87  CALCIUM 9.5  AST 37  ALT 22  ALKPHOS 92  BILITOT 1.3*   Cardiac Enzymes No results for input(s): TROPONINI in the last 168 hours. RADIOLOGY:  CT ABDOMEN PELVIS W CONTRAST  Result Date: 12/17/2019 CLINICAL DATA:  64 year old female with history of nausea and vomiting. Suspected bowel obstruction. EXAM: CT ABDOMEN AND PELVIS WITH CONTRAST TECHNIQUE: Multidetector CT imaging of the abdomen and pelvis was performed using the standard protocol following bolus administration of intravenous contrast. CONTRAST:  186m OMNIPAQUE IOHEXOL 300 MG/ML  SOLN COMPARISON:  CT the abdomen and pelvis 04/26/2012. FINDINGS: Lower chest: Mild nodular scarring in the inferior segment of the lingula. Small hiatal hernia. Hepatobiliary: No suspicious cystic or solid hepatic lesions. No intra or extrahepatic biliary ductal dilatation. Gallbladder is normal in appearance. Pancreas: No pancreatic mass. No pancreatic ductal dilatation. No pancreatic or peripancreatic fluid collections or inflammatory changes. Spleen: Unremarkable. Adrenals/Urinary Tract: Bilateral kidneys and bilateral adrenal glands are normal in appearance. No hydroureteronephrosis. Urinary bladder is normal in appearance. Stomach/Bowel: Intra-abdominal portion of the stomach is normal. No pathologic dilatation of small bowel. There is  some mural thickening in the colon involving predominantly the descending colon where there are subtle surrounding  inflammatory changes and a trace volume of fluid in the left pericolic gutter, concerning for colitis. Mild gaseous distension is noted throughout much of the colon, particularly proximal to the descending colon. Normal appendix. Vascular/Lymphatic: Aortic atherosclerosis, without evidence of aneurysm or dissection in the abdominal or pelvic vasculature. No lymphadenopathy noted in the abdomen or pelvis. Reproductive: Status post hysterectomy. Ovaries are not confidently identified may be surgically absent or atrophic Other: Trace volume of fluid in the left pericolic gutter. No larger volume of ascites. No pneumoperitoneum. Musculoskeletal: There are no aggressive appearing lytic or blastic lesions noted in the visualized portions of the skeleton. IMPRESSION: 1. Mural thickening and inflammation associated with the descending colon, compatible with acute colitis. 2. Aortic atherosclerosis. 3. Additional incidental findings, as above. Electronically Signed   By: Vinnie Langton M.D.   On: 12/17/2019 16:03   ASSESSMENT AND PLAN:   Aimee Stuart  is a 64 y.o. female with a known history of anxiety, depression, history of insomnia comes to the emergency room with acute onset severe nausea vomiting and left lower quadrant abdominal pain  1. SIRS due to Acute colitis, left lower quadrant--POA -patient presented with nausea vomiting left lower quadrant abdominal pain starting early hours of the morning -elevated white count of 20.3-->32K -BP 171/103, RR 24 - LA 2.9--repeat 2.5 -CT abdomen suggestive of acute colitis, descending Colon -clear liquid diet -IV fluids -IV Cipro and Flagyl-- pharmacy consult placed to change to IV zosyn -PRN Zofran  -IV PO and PRN pain meds - G.I. consultation with Dr [name]  2. Leukocytosis due to number one -continue to monitor  3. History of depression/anxiety -Wellbutrin and Klonopin-- resume once able to start orals  4. History of smoking -smoking cessation  counseling done  5. H/o Insomnia -takes trazodone qhs  6. DVT prophylaxis subcu Lovenox  7. Elevated BP w/o h/o HTN--likely due to pain with N/V -prn IV hydralazine -consider antihypertensives if BP remains elevated  8.Hyperglycemia--No h/o DM-2 -  A1c 5.1   Family Communication :Husband in the room Consults :GI --Dr [name] Code Status :FULL code DVT prophylaxis :lovenox Discharge disposition: Home in few days--needs GI evaluation and IV abx   TOTAL TIME TAKING CARE OF THIS PATIENT: *30* minutes.  >50% time spent on counselling and coordination of care  Note: This dictation was prepared with Dragon dictation along with smaller phrase technology. Any transcriptional errors that result from this process are unintentional.  Fritzi Mandes M.D    [facility]   CC: Primary care physician; [name], MDPatient ID: Aimee Stuart, female   DOB: [birth year], 64 y.o.   MRN: [id number]

## 2019-12-18 NOTE — Consult Note (Signed)
[facility] SURGICAL CONSULTATION NOTE (initial) - cpt: [id number]   HISTORY OF PRESENT ILLNESS (HPI):  64 y.o. female presented to [facility] ED yesterday for evaluation of abdominal pain. Patient reports that over the course of 24 hours prior to presentation she noticed the acute onset of severe LLQ abdominal pain. The pain came on suddenly and was described as a sharp pain. She notes associated nausea and non-bloody, non-bilious emesis with the pain. Additionally notes constipation not relieve by laxative. No associated fever, chills, cough, congestion, SOB, CP, or urinary changes. No history of similar in the past. Work up in the ED was concerning for leukocytosis to 20.5K, lactic acidosis to 2.9, and CT scan concerning for colitis and large bowel dilation. She was admitted to medicine for work up and management.   Since admission, she reports that she has had some improvement in her abdominal pain although her nausea has become her biggest complaint. No flatus or BM. Her leukocytosis has worsened to 32.8K. Lactic acidosis improving to 2.5. Stool studies are pending. Patient is empirically started on Cipro and Flagyl yesterday but has since been switched to Zosyn.   Surgery is consulted by Hospitalist physician Dr. Fritzi Mandes, MD in this context for evaluation and management of acute colitis.   PAST MEDICAL HISTORY (PMH):  Past Medical History:  Diagnosis Date  . Acute renal failure (HCC)    due to bladder damage during hysterectomy  . Insomnia   . Wears contact lenses    sometimes     PAST SURGICAL HISTORY ([place]):  Past Surgical History:  Procedure Laterality Date  . ABDOMINAL HYSTERECTOMY  2013   [facility]  . BLADDER REPAIR     damaged during hysterectomy  . CERVICAL BIOPSY  W/ LOOP ELECTRODE EXCISION  07/2008  . CESAREAN SECTION    . COLONOSCOPY WITH PROPOFOL N/A 12/20/2016   Procedure: COLONOSCOPY WITH PROPOFOL;  Surgeon: [name], MD;  Location: [facility];   Service: Endoscopy;  Laterality: N/A;  . COLPOSCOPY  2009  . KNEE SURGERY Right    age 2     MEDICATIONS:  Prior to Admission medications   Medication Sig Start Date End Date Taking? Authorizing Provider  buPROPion (WELLBUTRIN XL) 150 MG 24 hr tablet Take 150 mg by mouth daily. 11/17/19  Yes [provider]  clonazePAM (KLONOPIN) 0.5 MG tablet Take 0.25 mg by mouth at bedtime as needed for anxiety.   Yes [provider]  fluticasone (FLONASE) 50 MCG/ACT nasal spray Place 1-2 sprays into both nostrils daily as needed for allergies or rhinitis.   Yes [provider]  Multiple Vitamin (MULTIVITAMIN WITH MINERALS) TABS tablet Take 1 tablet by mouth daily.   Yes [provider]  traZODone (DESYREL) 100 MG tablet TAKE 1 TABLET BY MOUTH EVERYDAY AT BEDTIME Patient taking differently: Take 100 mg by mouth at bedtime.  07/01/18  Yes [name], MD     ALLERGIES:  No Known Allergies   SOCIAL HISTORY:  Social History   Socioeconomic History  . Marital status: Married    Spouse name: Not on file  . Number of children: Not on file  . Years of education: Not on file  . Highest education level: Not on file  Occupational History  . Not on file  Tobacco Use  . Smoking status: Current Every Day Smoker    Packs/day: 0.50    Types: Cigarettes  . Smokeless tobacco: Never Used  Substance and Sexual Activity  . Alcohol use: Yes  Alcohol/week: 0.0 standard drinks  . Drug use: No  . Sexual activity: Never  Other Topics Concern  . Not on file  Social History Narrative  . Not on file   Social Determinants of Health   Financial Resource Strain:   . Difficulty of Paying Living Expenses:   Food Insecurity:   . Worried About Charity fundraiser in the Last Year:   . Arboriculturist in the Last Year:   Transportation Needs:   . Film/video editor (Medical):   Marland Kitchen Lack of Transportation (Non-Medical):   Physical Activity:   . Days of Exercise per  Week:   . Minutes of Exercise per Session:   Stress:   . Feeling of Stress :   Social Connections:   . Frequency of Communication with Friends and Family:   . Frequency of Social Gatherings with Friends and Family:   . Attends Religious Services:   . Active Member of Clubs or Organizations:   . Attends Archivist Meetings:   Marland Kitchen Marital Status:   Intimate Partner Violence:   . Fear of Current or Ex-Partner:   . Emotionally Abused:   Marland Kitchen Physically Abused:   . Sexually Abused:      FAMILY HISTORY:  Family History  Problem Relation Age of Onset  . Alzheimer's disease Mother   . Cancer Father 52       prostate  . Heart disease Father       REVIEW OF SYSTEMS:  Review of Systems  Constitutional: Negative for chills and fever.  HENT: Negative for congestion and sore throat.   Respiratory: Negative for cough and shortness of breath.   Cardiovascular: Negative for chest pain and palpitations.  Gastrointestinal: Positive for abdominal pain, constipation, nausea and vomiting. Negative for blood in stool and diarrhea.  All other systems reviewed and are negative.   VITAL SIGNS:  Temp:  [98.1 F (36.7 C)-100.4 F (38 C)] 98.4 F (36.9 C) (03/16 0747) Pulse Rate:  [85-96] 96 (03/16 0747) Resp:  [14-25] 20 (03/15 1800) BP: (154-178)/(81-98) 154/81 (03/16 0747) SpO2:  [93 %-100 %] 100 % (03/16 0747)     Height: 5' 8"  (172.7 cm) Weight: 71.7 kg BMI (Calculated): 24.03   INTAKE/OUTPUT:  03/15 0701 - 03/16 0700 In: 760.4 [I.V.:660.4; IV Piggyback:100] Out: -   PHYSICAL EXAM:  Physical Exam Vitals and nursing note reviewed.  Constitutional:      General: She is not in acute distress.    Appearance: Normal appearance. She is not ill-appearing.     Comments: Patient sitting up in bed  Eyes:     General: No scleral icterus.    Conjunctiva/sclera: Conjunctivae normal.  Cardiovascular:     Rate and Rhythm: Normal rate and regular rhythm.     Pulses: Normal pulses.      Heart sounds: No murmur.  Pulmonary:     Effort: Pulmonary effort is normal. No respiratory distress.     Breath sounds: Normal breath sounds.  Abdominal:     General: There is distension.     Tenderness: There is generalized abdominal tenderness (Mild). There is no guarding or rebound.     Comments: Abdomen is grossly distended, no focal tenderness, mild soreness throughout, tympanic to percussion, no rebound/guarding, no overt peritonitis. Patient was able to sit up and ambulate to bathroom which is reassuring.   Genitourinary:    Comments: Deferred Musculoskeletal:        General: Normal range of motion.  Right lower leg: No edema.     Left lower leg: No edema.     Comments: Ambulated to the bathroom  Skin:    General: Skin is warm and dry.     Coloration: Skin is not pale.     Findings: No erythema.  Neurological:     General: No focal deficit present.     Mental Status: She is alert and oriented to person, place, and time.  Psychiatric:        Mood and Affect: Mood normal.        Behavior: Behavior normal.      Labs:  CBC Latest Ref Rng & Units 12/18/2019 12/17/2019 12/11/2019  WBC 4.0 - 10.5 K/uL 32.8(H) 20.5(H) 6.8  Hemoglobin 12.0 - 15.0 g/dL 13.4 13.8 13.6  Hematocrit 36.0 - 46.0 % 38.8 40.5 42.0  Platelets 150 - 400 K/uL 300 292 280   CMP Latest Ref Rng & Units 12/17/2019 12/11/2019 11/24/2016  Glucose 70 - 99 mg/dL 176(H) 90 85  BUN 8 - 23 mg/dL 14 14 12   Creatinine 0.44 - 1.00 mg/dL 0.87 0.94 0.99  Sodium 135 - 145 mmol/L 139 145(H) 144  Potassium 3.5 - 5.1 mmol/L 3.6 5.6(H) 5.2  Chloride 98 - 111 mmol/L 106 105 103  CO2 22 - 32 mmol/L 22 26 -  Calcium 8.9 - 10.3 mg/dL 9.5 10.4(H) 9.7  Total Protein 6.5 - 8.1 g/dL 7.4 7.3 6.7  Total Bilirubin 0.3 - 1.2 mg/dL 1.3(H) 0.4 0.3  Alkaline Phos 38 - 126 U/L 92 114 96  AST 15 - 41 U/L 37 27 17  ALT 0 - 44 U/L 22 23 14      Imaging studies:   CT Abdomen/Pelvis (12/17/2019) personally reviewed which does show  thickening of the descending colon with mild stranding concerning for colitis with proximal colonic distension noted, and radiologist report reviewed:  IMPRESSION: 1. Mural thickening and inflammation associated with the descending colon, compatible with acute colitis. 2. Aortic atherosclerosis. 3. Additional incidental findings, as above.   Assessment/Plan: (ICD-10's: K52.9) 64 y.o. female with increasing leukocytosis, lactic acidosis, abdominal pain, and distension concerning for acute colitis of uncertain etiology without overt peritonitis on examination.   - Recommend close monitoring; serial abdominal examinations; morning KUB to reassess colonic distension  - No emergent surgical intervention; will follow closely   - NPO  - IVF Resuscitation  - Agree with IV Abx (Zosyn)  - Trend leukocytosis; lactic acid  - Agree with enemas; stools studies   - Appreciate GI involvement and recommendations  - Further management per primary service   All of the above findings and recommendations were discussed with the patient, and all of patient's questions were answered to her expressed satisfaction.  Thank you for the opportunity to participate in this patient's care.   -- Edison Simon, PA-C [facility] 12/18/2019, 2:46 PM [phone] M-F: 7am - 4pm

## 2019-12-18 NOTE — Plan of Care (Signed)
  Problem: Education: Goal: Knowledge of General Education information will improve Description: Including pain rating scale, medication(s)/side effects and non-pharmacologic comfort measures Outcome: Progressing   Problem: Health Behavior/Discharge Planning: Goal: Ability to manage health-related needs will improve Outcome: Progressing   Problem: Clinical Measurements: Goal: Ability to maintain clinical measurements within normal limits will improve Outcome: Progressing Goal: Will remain free from infection Outcome: Progressing Goal: Diagnostic test results will improve Outcome: Progressing Goal: Respiratory complications will improve Outcome: Progressing Goal: Cardiovascular complication will be avoided Outcome: Progressing   Problem: Activity: Goal: Risk for activity intolerance will decrease Outcome: Progressing   Problem: Coping: Goal: Level of anxiety will decrease Outcome: Progressing   Problem: Nutrition: Goal: Adequate nutrition will be maintained Outcome: Progressing

## 2019-12-18 NOTE — Consult Note (Signed)
Pharmacy Antibiotic Note  Aimee Stuart is a 64 y.o. female admitted on 12/17/2019 with intra-abdominal infection.  Pharmacy has been consulted for Zosyn dosing. Her renal function appears to be at baseline and un-impaired.  Plan:       1. Zosyn 3.75 g Q8H - extended infusion  Height: 5' 8"  (172.7 cm) Weight: 158 lb (71.7 kg) IBW/kg (Calculated) : 63.9  Temp (24hrs), Avg:98.6 F (37 C), Min:97.9 F (36.6 C), Max:100.4 F (38 C)  Recent Labs  Lab 12/17/19 1309 12/17/19 1619 12/18/19 0404  WBC 20.5*  --  32.8*  CREATININE 0.87  --   --   LATICACIDVEN  --  2.9* 2.5*    Estimated Creatinine Clearance: 66.8 mL/min (by C-G formula based on SCr of 0.87 mg/dL).    No Known Allergies  Antimicrobials this admission: metronidazole 3/15 >> 3/15 ciprofloxacin 3/15 >> 3/16 Zosyn 3/16 >>   Microbiology results: 3/15 UCx: pending  3/15 SARS CoV-2: pending  3/15 C diff: pending 3/15 GI panel: pending  Thank you for allowing pharmacy to be a part of this patient's care.  Rowland Lathe 12/18/2019 12:44 PM

## 2019-12-18 NOTE — Consult Note (Signed)
Aimee Darby, MD [address]  [place]  [place], [place] [address]  Main: [phone]  Fax: [phone] Pager: [phone]   Consultation  Referring Provider:     No ref. provider found Primary Care Physician:  Aimee Late, MD Primary Gastroenterologist:  Dr. Lucilla Stuart         Reason for Consultation:     Acute colitis  Date of Admission:  12/17/2019 Date of Consultation:  12/18/2019         HPI:   Aimee Stuart is a 64 y.o. female with history of chronic tobacco use presented to ER yesterday due to progressively worsening left lower quadrant pain that started on Sunday associated with nausea, vomiting.  Patient just returned from [place], reports that she " ate a lot, lot of carbs, [place] food".  She reports she has been constipated and her stools are generally hard balls.  She did not have a bowel movement the last 4 days.  She took 2 Ex-Lax on Monday that resulted in seepage only.  Her abdominal pain got worse after taking laxative and therefore came to ER.  In the ER, she is hemodynamically stable, found to have significant leukocytosis 20.5K, hemoglobin normal.  She underwent CT abdomen pelvis which revealed mild thickening of the descending colon associated with gaseous distention proximal to the descending colon.  Patient is empirically started on Cipro and Flagyl yesterday.  Switched to Zosyn today as her leukocytosis worsened to 32K.  She denies passing gas.  When I interviewed the patient, she was drowsy and she has taken morphine prior to that.  She also reports feeling nauseous and has been receiving Reglan as well as Phenergan  Stool studies were ordered   She does smoke cigarettes daily, intermittent alcohol use  NSAIDs: None  Antiplts/Anticoagulants/Anti thrombotics: None  GI Procedures: Underwent colonoscopy in 2018 by Dr. Allen Norris Poor prep, repeat colonoscopy was recommended which she did not undergo yet  Past Medical History:  Diagnosis  Date  . Acute renal failure (HCC)    due to bladder damage during hysterectomy  . Insomnia   . Wears contact lenses    sometimes    Past Surgical History:  Procedure Laterality Date  . ABDOMINAL HYSTERECTOMY  2013   [facility]  . BLADDER REPAIR     damaged during hysterectomy  . CERVICAL BIOPSY  W/ LOOP ELECTRODE EXCISION  07/2008  . CESAREAN SECTION    . COLONOSCOPY WITH PROPOFOL N/A 12/20/2016   Procedure: COLONOSCOPY WITH PROPOFOL;  Surgeon: Aimee Lame, MD;  Location: [facility];  Service: Endoscopy;  Laterality: N/A;  . COLPOSCOPY  2009  . KNEE SURGERY Right    age 72    Prior to Admission medications   Medication Sig Start Date End Date Taking? Authorizing Provider  buPROPion (WELLBUTRIN XL) 150 MG 24 hr tablet Take 150 mg by mouth daily. 11/17/19  Yes [provider]  clonazePAM (KLONOPIN) 0.5 MG tablet Take 0.25 mg by mouth at bedtime as needed for anxiety.   Yes [provider]  fluticasone (FLONASE) 50 MCG/ACT nasal spray Place 1-2 sprays into both nostrils daily as needed for allergies or rhinitis.   Yes [provider]  Multiple Vitamin (MULTIVITAMIN WITH MINERALS) TABS tablet Take 1 tablet by mouth daily.   Yes [provider]  traZODone (DESYREL) 100 MG tablet TAKE 1 TABLET BY MOUTH EVERYDAY AT BEDTIME Patient taking differently: Take 100 mg by mouth at bedtime.  07/01/18  Yes [name], MD    Current Facility-Administered Medications:  .  acetaminophen (TYLENOL) tablet 650 mg, 650 mg, Oral, Q6H PRN, 650 mg at 12/17/19 1858 **OR** acetaminophen (TYLENOL) suppository 650 mg, 650 mg, Rectal, Q6H PRN, Fritzi Mandes, MD .  enoxaparin (LOVENOX) injection 40 mg, 40 mg, Subcutaneous, Q24H, Fritzi Mandes, MD, 40 mg at 12/17/19 2139 .  hydrALAZINE (APRESOLINE) injection 10 mg, 10 mg, Intravenous, Q6H PRN, Fritzi Mandes, MD .  ketorolac (TORADOL) 30 MG/ML injection 30 mg, 30 mg, Intravenous, Q6H PRN, Fritzi Mandes, MD, 30 mg at 12/18/19  1234 .  lactated ringers infusion, , Intravenous, Continuous, Merlyn Lot, MD, Last Rate: 125 mL/hr at 12/18/19 0900, New Bag at 12/18/19 0900 .  metoCLOPramide (REGLAN) injection 5 mg, 5 mg, Intravenous, Q6H PRN, Fritzi Mandes, MD, 5 mg at 12/18/19 1412 .  morphine 4 MG/ML injection 4 mg, 4 mg, Intravenous, Q3H PRN, Merlyn Lot, MD, 4 mg at 12/18/19 0953 .  ondansetron (ZOFRAN) tablet 4 mg, 4 mg, Oral, Q4H PRN **OR** ondansetron (ZOFRAN) injection 4 mg, 4 mg, Intravenous, Q6H PRN, Fritzi Mandes, MD .  piperacillin-tazobactam (ZOSYN) IVPB 3.375 g, 3.375 g, Intravenous, Q8H, Rowland Lathe, RPH, Last Rate: 12.5 mL/hr at 12/18/19 0902, 3.375 g at 12/18/19 0902 .  polyethylene glycol (MIRALAX / GLYCOLAX) packet 17 g, 17 g, Oral, BID, Racer Quam, [name], MD, 17 g at 12/18/19 1414 .  promethazine (PHENERGAN) injection 12.5 mg, 12.5 mg, Intravenous, Q6H PRN, Merlyn Lot, MD, 12.5 mg at 12/18/19 0735 .  traMADol (ULTRAM) tablet 50 mg, 50 mg, Oral, Q6H PRN, Fritzi Mandes, MD, 50 mg at 12/17/19 1858  Family History  Problem Relation Age of Onset  . Alzheimer's disease Mother   . Cancer Father 38       prostate  . Heart disease Father      Social History   Tobacco Use  . Smoking status: Current Every Day Smoker    Packs/day: 0.50    Types: Cigarettes  . Smokeless tobacco: Never Used  Substance Use Topics  . Alcohol use: Yes    Alcohol/week: 0.0 standard drinks  . Drug use: No    Allergies as of 12/17/2019  . (No Known Allergies)    Review of Systems:    All systems reviewed and negative except where noted in HPI.   Physical Exam:  Vital signs in last 24 hours: Temp:  [98.1 F (36.7 C)-100.4 F (38 C)] 98.4 F (36.9 C) (03/16 0747) Pulse Rate:  [85-96] 96 (03/16 0747) Resp:  [14-25] 20 (03/15 1800) BP: (154-178)/(81-98) 154/81 (03/16 0747) SpO2:  [93 %-100 %] 100 % (03/16 0747) Last BM Date: 12/17/19 General: Drowsy, answering questions appropriately, cooperative  in NAD Head:  Normocephalic and atraumatic. Eyes:   No icterus.   Conjunctiva pink. PERRLA. Ears:  Normal auditory acuity. Neck:  Supple; no masses or thyroidomegaly Lungs: Respirations even and unlabored. Lungs clear to auscultation bilaterally.   No wheezes, crackles, or rhonchi.  Heart:  Regular rate and rhythm;  Without murmur, clicks, rubs or gallops Abdomen:  Soft, moderately distended, tympanic to percussion, diffuse abdominal tenderness, worse in left upper and lower quadrants, hypoactive bowel sounds. No appreciable masses or hepatomegaly.  No rebound, voluntary guarding.  Rectal:  Not performed. Msk:  Symmetrical without gross deformities.  Strength normal Extremities:  Without edema, cyanosis or clubbing. Neurologic:  Alert and oriented x3;  grossly normal neurologically. Skin:  Intact without significant lesions or rashes. Psych:  Alert and cooperative. Normal affect.  LAB RESULTS: CBC Latest Ref Rng & Units 12/18/2019 12/17/2019 12/11/2019  WBC 4.0 - 10.5 K/uL 32.8(H) 20.5(H) 6.8  Hemoglobin 12.0 - 15.0 g/dL 13.4 13.8 13.6  Hematocrit 36.0 - 46.0 % 38.8 40.5 42.0  Platelets 150 - 400 K/uL 300 292 280    BMET BMP Latest Ref Rng & Units 12/17/2019 12/11/2019 11/24/2016  Glucose 70 - 99 mg/dL 176(H) 90 85  BUN 8 - 23 mg/dL 14 14 12   Creatinine 0.44 - 1.00 mg/dL 0.87 0.94 0.99  BUN/Creat Ratio 12 - 28 - 15 12  Sodium 135 - 145 mmol/L 139 145(H) 144  Potassium 3.5 - 5.1 mmol/L 3.6 5.6(H) 5.2  Chloride 98 - 111 mmol/L 106 105 103  CO2 22 - 32 mmol/L 22 26 -  Calcium 8.9 - 10.3 mg/dL 9.5 10.4(H) 9.7    LFT Hepatic Function Latest Ref Rng & Units 12/17/2019 12/11/2019 11/24/2016  Total Protein 6.5 - 8.1 g/dL 7.4 7.3 6.7  Albumin 3.5 - 5.0 g/dL 4.2 4.8 4.5  AST 15 - 41 U/L 37 27 17  ALT 0 - 44 U/L 22 23 14   Alk Phosphatase 38 - 126 U/L 92 114 96  Total Bilirubin 0.3 - 1.2 mg/dL 1.3(H) 0.4 0.3     STUDIES: CT ABDOMEN PELVIS W CONTRAST  Result Date: 12/17/2019 CLINICAL DATA:   64 year old female with history of nausea and vomiting. Suspected bowel obstruction. EXAM: CT ABDOMEN AND PELVIS WITH CONTRAST TECHNIQUE: Multidetector CT imaging of the abdomen and pelvis was performed using the standard protocol following bolus administration of intravenous contrast. CONTRAST:  122m OMNIPAQUE IOHEXOL 300 MG/ML  SOLN COMPARISON:  CT the abdomen and pelvis 04/26/2012. FINDINGS: Lower chest: Mild nodular scarring in the inferior segment of the lingula. Small hiatal hernia. Hepatobiliary: No suspicious cystic or solid hepatic lesions. No intra or extrahepatic biliary ductal dilatation. Gallbladder is normal in appearance. Pancreas: No pancreatic mass. No pancreatic ductal dilatation. No pancreatic or peripancreatic fluid collections or inflammatory changes. Spleen: Unremarkable. Adrenals/Urinary Tract: Bilateral kidneys and bilateral adrenal glands are normal in appearance. No hydroureteronephrosis. Urinary bladder is normal in appearance. Stomach/Bowel: Intra-abdominal portion of the stomach is normal. No pathologic dilatation of small bowel. There is some mural thickening in the colon involving predominantly the descending colon where there are subtle surrounding inflammatory changes and a trace volume of fluid in the left pericolic gutter, concerning for colitis. Mild gaseous distension is noted throughout much of the colon, particularly proximal to the descending colon. Normal appendix. Vascular/Lymphatic: Aortic atherosclerosis, without evidence of aneurysm or dissection in the abdominal or pelvic vasculature. No lymphadenopathy noted in the abdomen or pelvis. Reproductive: Status post hysterectomy. Ovaries are not confidently identified may be surgically absent or atrophic Other: Trace volume of fluid in the left pericolic gutter. No larger volume of ascites. No pneumoperitoneum. Musculoskeletal: There are no aggressive appearing lytic or blastic lesions noted in the visualized portions of the  skeleton. IMPRESSION: 1. Mural thickening and inflammation associated with the descending colon, compatible with acute colitis. 2. Aortic atherosclerosis. 3. Additional incidental findings, as above. Electronically Signed   By: DVinnie LangtonM.D.   On: 12/17/2019 16:03      Impression / Plan:   KKALINDA ROMANIELLOis a 64y.o. female with history of chronic tobacco use, chronic constipation presented with 3 days of worsening left-sided abdominal pain associated with nausea, vomiting and obstipation.  CT abdomen and pelvis revealed left-sided colitis.  There is no obvious evidence of diverticulitis  Left-sided abdominal pain  with obstipation, ileus: There is no evidence of bowel obstruction based on the CT scan Patient has history of chronic constipation and tobacco use Differentials include acute ischemic colitis or acute diverticulitis in setting of severe constipation or underlying neoplasm or new onset of severe IBD Recommend bowel regimen with MiraLAX twice daily Okay with clear liquid diet only Agree with empiric antibiotics Serial abdominal exam, x-ray KUB in a.m. Recommend general surgery consult Defer endoscopic intervention at this time in setting of ileus and obstipation  Thank you for involving me in the care of this patient.  GI will follow along with you    LOS: 1 day   [name], MD  12/18/2019, 2:44 PM   Note: This dictation was prepared with Dragon dictation along with smaller phrase technology. Any transcriptional errors that result from this process are unintentional.

## 2019-12-19 ENCOUNTER — Inpatient Hospital Stay: Payer: Managed Care, Other (non HMO)

## 2019-12-19 DIAGNOSIS — I10 Essential (primary) hypertension: Secondary | ICD-10-CM

## 2019-12-19 LAB — CBC WITH DIFFERENTIAL/PLATELET
Abs Immature Granulocytes: 0.54 10*3/uL — ABNORMAL HIGH (ref 0.00–0.07)
Basophils Absolute: 0.1 10*3/uL (ref 0.0–0.1)
Basophils Relative: 0 %
Eosinophils Absolute: 0 10*3/uL (ref 0.0–0.5)
Eosinophils Relative: 0 %
HCT: 36.2 % (ref 36.0–46.0)
Hemoglobin: 12.3 g/dL (ref 12.0–15.0)
Immature Granulocytes: 2 %
Lymphocytes Relative: 4 %
Lymphs Abs: 1.2 10*3/uL (ref 0.7–4.0)
MCH: 29.6 pg (ref 26.0–34.0)
MCHC: 34 g/dL (ref 30.0–36.0)
MCV: 87.2 fL (ref 80.0–100.0)
Monocytes Absolute: 2.7 10*3/uL — ABNORMAL HIGH (ref 0.1–1.0)
Monocytes Relative: 9 %
Neutro Abs: 25.6 10*3/uL — ABNORMAL HIGH (ref 1.7–7.7)
Neutrophils Relative %: 85 %
Platelets: 263 10*3/uL (ref 150–400)
RBC: 4.15 MIL/uL (ref 3.87–5.11)
RDW: 13.5 % (ref 11.5–15.5)
Smear Review: NORMAL
WBC: 30.1 10*3/uL — ABNORMAL HIGH (ref 4.0–10.5)
nRBC: 0 % (ref 0.0–0.2)

## 2019-12-19 LAB — BASIC METABOLIC PANEL
Anion gap: 9 (ref 5–15)
BUN: 22 mg/dL (ref 8–23)
CO2: 26 mmol/L (ref 22–32)
Calcium: 8 mg/dL — ABNORMAL LOW (ref 8.9–10.3)
Chloride: 100 mmol/L (ref 98–111)
Creatinine, Ser: 0.94 mg/dL (ref 0.44–1.00)
GFR calc Af Amer: >60 mL/min (ref >60)
GFR calc non Af Amer: >60 mL/min (ref >60)
Glucose, Bld: 146 mg/dL — ABNORMAL HIGH (ref 70–99)
Potassium: 3.7 mmol/L (ref 3.5–5.1)
Sodium: 135 mmol/L (ref 135–145)

## 2019-12-19 LAB — MAGNESIUM: Magnesium: 2.5 mg/dL — ABNORMAL HIGH (ref 1.7–2.4)

## 2019-12-19 LAB — LACTIC ACID, PLASMA
Lactic Acid, Venous: 2.2 mmol/L (ref 0.5–1.9)
Lactic Acid, Venous: 4.8 mmol/L (ref 0.5–1.9)

## 2019-12-19 LAB — PHOSPHORUS: Phosphorus: 1.6 mg/dL — ABNORMAL LOW (ref 2.5–4.6)

## 2019-12-19 MED ORDER — MAGNESIUM SULFATE 2 GM/50ML IV SOLN
2.0000 g | Freq: Once | INTRAVENOUS | Status: AC
  Administered 2019-12-19: 2 g via INTRAVENOUS
  Filled 2019-12-19: qty 50

## 2019-12-19 NOTE — Progress Notes (Signed)
Aimee Darby, MD [address]  [place]  [place], [place] [address]  Main: [phone]  Fax: [phone] Pager: [phone]   Subjective: Patient reports feeling better today.  Nausea and abdominal pain are improving.  She did have a bowel movement which was loose and mixed with solid stool this morning.  She is on ice chips   Objective: Vital signs in last 24 hours: Vitals:   12/18/19 0747 12/18/19 1637 12/19/19 0008 12/19/19 0440  BP: (!) 154/81 (!) 154/82 (!) 163/83   Pulse: 96 93 (!) 110   Resp:  17 16   Temp: 98.4 F (36.9 C) 99.4 F (37.4 C) 100.3 F (37.9 C) 98.1 F (36.7 C)  TempSrc:  Oral Oral Oral  SpO2: 100% 99% 97%   Weight:      Height:       Weight change:  No intake or output data in the 24 hours ending 12/19/19 1125   Exam: Heart:: Tachycardia, regular rhythm Lungs: normal and clear to auscultation Abdomen: soft, nontender, less distended, normal bowel sounds   Lab Results: CBC Latest Ref Rng & Units 12/19/2019 12/18/2019 12/17/2019  WBC 4.0 - 10.5 K/uL 30.1(H) 32.8(H) 20.5(H)  Hemoglobin 12.0 - 15.0 g/dL 12.3 13.4 13.8  Hematocrit 36.0 - 46.0 % 36.2 38.8 40.5  Platelets 150 - 400 K/uL 263 300 292   CMP Latest Ref Rng & Units 12/19/2019 12/17/2019 12/11/2019  Glucose 70 - 99 mg/dL 146(H) 176(H) 90  BUN 8 - 23 mg/dL 22 14 14   Creatinine 0.44 - 1.00 mg/dL 0.94 0.87 0.94  Sodium 135 - 145 mmol/L 135 139 145(H)  Potassium 3.5 - 5.1 mmol/L 3.7 3.6 5.6(H)  Chloride 98 - 111 mmol/L 100 106 105  CO2 22 - 32 mmol/L 26 22 26   Calcium 8.9 - 10.3 mg/dL 8.0(L) 9.5 10.4(H)  Total Protein 6.5 - 8.1 g/dL - 7.4 7.3  Total Bilirubin 0.3 - 1.2 mg/dL - 1.3(H) 0.4  Alkaline Phos 38 - 126 U/L - 92 114  AST 15 - 41 U/L - 37 27  ALT 0 - 44 U/L - 22 23    Micro Results: Recent Results (from the past 240 hour(s))  SARS CORONAVIRUS 2 (TAT 6-24 HRS) Nasopharyngeal Nasopharyngeal Swab     Status: None   Collection Time: 12/17/19  4:48 PM   Specimen:  Nasopharyngeal Swab  Result Value Ref Range Status   SARS Coronavirus 2 NEGATIVE NEGATIVE Final    Comment: (NOTE) SARS-CoV-2 target nucleic acids are NOT DETECTED. The SARS-CoV-2 RNA is generally detectable in upper and lower respiratory specimens during the acute phase of infection. Negative results do not preclude SARS-CoV-2 infection, do not rule out co-infections with other pathogens, and should not be used as the sole basis for treatment or other patient management decisions. Negative results must be combined with clinical observations, patient history, and epidemiological information. The expected result is Negative. Fact Sheet for Patients: SugarRoll.be Fact Sheet for Healthcare Providers: https://www.woods-mathews.com/ This test is not yet approved or cleared by the [place] FDA and  has been authorized for detection and/or diagnosis of SARS-CoV-2 by FDA under an Emergency Use Authorization (EUA). This EUA will remain  in effect (meaning this test can be used) for the duration of the COVID-19 declaration under Section 56 4(b)(1) of the Act, 21 U.S.C. section 360bbb-3(b)(1), unless the authorization is terminated or revoked sooner. Performed at [facility], [place] [address], [place], [place] [address]    Studies/Results: CT  ABDOMEN PELVIS W CONTRAST  Result Date: 12/17/2019 CLINICAL DATA:  64 year old female with history of nausea and vomiting. Suspected bowel obstruction. EXAM: CT ABDOMEN AND PELVIS WITH CONTRAST TECHNIQUE: Multidetector CT imaging of the abdomen and pelvis was performed using the standard protocol following bolus administration of intravenous contrast. CONTRAST:  127m OMNIPAQUE IOHEXOL 300 MG/ML  SOLN COMPARISON:  CT the abdomen and pelvis 04/26/2012. FINDINGS: Lower chest: Mild nodular scarring in the inferior segment of the lingula. Small hiatal hernia. Hepatobiliary: No suspicious cystic or solid hepatic  lesions. No intra or extrahepatic biliary ductal dilatation. Gallbladder is normal in appearance. Pancreas: No pancreatic mass. No pancreatic ductal dilatation. No pancreatic or peripancreatic fluid collections or inflammatory changes. Spleen: Unremarkable. Adrenals/Urinary Tract: Bilateral kidneys and bilateral adrenal glands are normal in appearance. No hydroureteronephrosis. Urinary bladder is normal in appearance. Stomach/Bowel: Intra-abdominal portion of the stomach is normal. No pathologic dilatation of small bowel. There is some mural thickening in the colon involving predominantly the descending colon where there are subtle surrounding inflammatory changes and a trace volume of fluid in the left pericolic gutter, concerning for colitis. Mild gaseous distension is noted throughout much of the colon, particularly proximal to the descending colon. Normal appendix. Vascular/Lymphatic: Aortic atherosclerosis, without evidence of aneurysm or dissection in the abdominal or pelvic vasculature. No lymphadenopathy noted in the abdomen or pelvis. Reproductive: Status post hysterectomy. Ovaries are not confidently identified may be surgically absent or atrophic Other: Trace volume of fluid in the left pericolic gutter. No larger volume of ascites. No pneumoperitoneum. Musculoskeletal: There are no aggressive appearing lytic or blastic lesions noted in the visualized portions of the skeleton. IMPRESSION: 1. Mural thickening and inflammation associated with the descending colon, compatible with acute colitis. 2. Aortic atherosclerosis. 3. Additional incidental findings, as above. Electronically Signed   By: DVinnie LangtonM.D.   On: 12/17/2019 16:03   DG Abd Portable 1V  Result Date: 12/19/2019 CLINICAL DATA:  Colitis EXAM: PORTABLE ABDOMEN - 1 VIEW COMPARISON:  Radiographs 04/20/2012 FINDINGS: Supine radiographs demonstrate gas-filled loops of colon at upper limits of normal measuring 7 cm in diameter. There is  stool in the ascending colon. No bowel wall thickening identified. No small bowel distension. IMPRESSION: Gas distended colon without evidence of inflammation. Electronically Signed   By: SSuzy BouchardM.D.   On: 12/19/2019 08:33   Medications:  I have reviewed the patient's current medications. Prior to Admission:  Medications Prior to Admission  Medication Sig Dispense Refill Last Dose   buPROPion (WELLBUTRIN XL) 150 MG 24 hr tablet Take 150 mg by mouth daily.   24+ hours at Unknown   clonazePAM (KLONOPIN) 0.5 MG tablet Take 0.25 mg by mouth at bedtime as needed for anxiety.   Unknown at PRN   fluticasone (FLONASE) 50 MCG/ACT nasal spray Place 1-2 sprays into both nostrils daily as needed for allergies or rhinitis.   Unknown at PRN   Multiple Vitamin (MULTIVITAMIN WITH MINERALS) TABS tablet Take 1 tablet by mouth daily.      traZODone (DESYREL) 100 MG tablet TAKE 1 TABLET BY MOUTH EVERYDAY AT BEDTIME (Patient taking differently: Take 100 mg by mouth at bedtime. ) 90 tablet 2 12+ hours at Unknown   Scheduled:  enoxaparin (LOVENOX) injection  40 mg Subcutaneous Q24H   polyethylene glycol  17 g Oral BID   Continuous:  lactated ringers 125 mL/hr at 12/18/19 0900   piperacillin-tazobactam (ZOSYN)  IV 3.375 g (12/19/19 0758)   PEXH:BZJIRCVELFYBO**OR** acetaminophen, hydrALAZINE, ketorolac, metoCLOPramide (REGLAN)  injection, morphine injection, ondansetron **OR** ondansetron (ZOFRAN) IV, promethazine, traMADol Anti-infectives (From admission, onward)   Start     Dose/Rate Route Frequency Ordered Stop   12/18/19 0800  piperacillin-tazobactam (ZOSYN) IVPB 3.375 g     3.375 g 12.5 mL/hr over 240 Minutes Intravenous Every 8 hours 12/18/19 0739     12/18/19 0600  ciprofloxacin (CIPRO) IVPB 400 mg  Status:  Discontinued     400 mg 200 mL/hr over 60 Minutes Intravenous Every 12 hours 12/17/19 1654 12/18/19 0737   12/18/19 0000  metroNIDAZOLE (FLAGYL) IVPB 500 mg  Status:  Discontinued     Note to Pharmacy: Adjust the time for next 8 hour dosing   500 mg 100 mL/hr over 60 Minutes Intravenous Every 8 hours 12/17/19 1646 12/18/19 0737   12/17/19 1645  metroNIDAZOLE (FLAGYL) IVPB 500 mg  Status:  Discontinued     500 mg 100 mL/hr over 60 Minutes Intravenous Every 8 hours 12/17/19 1644 12/17/19 1646   12/17/19 1615  metroNIDAZOLE (FLAGYL) IVPB 500 mg     500 mg 100 mL/hr over 60 Minutes Intravenous  Once 12/17/19 1611 12/17/19 1950   12/17/19 1615  ciprofloxacin (CIPRO) IVPB 400 mg     400 mg 200 mL/hr over 60 Minutes Intravenous  Once 12/17/19 1611 12/17/19 1823     Scheduled Meds:  enoxaparin (LOVENOX) injection  40 mg Subcutaneous Q24H   polyethylene glycol  17 g Oral BID   Continuous Infusions:  lactated ringers 125 mL/hr at 12/18/19 0900   piperacillin-tazobactam (ZOSYN)  IV 3.375 g (12/19/19 0758)   PRN Meds:.acetaminophen **OR** acetaminophen, hydrALAZINE, ketorolac, metoCLOPramide (REGLAN) injection, morphine injection, ondansetron **OR** ondansetron (ZOFRAN) IV, promethazine, traMADol   Assessment: Active Problems:   Acute colitis   Plan: Acute left-sided colitis of unclear etiology Clinically improving, mild improvement in leukocytosis Monitor electrolytes closely and replete as needed Okay to start clear liquids from GI standpoint If patient can tolerate clear liquids, will perform colonoscopy on Friday   LOS: 2 days   Aimee Stuart 12/19/2019, 11:25 AM

## 2019-12-19 NOTE — Progress Notes (Addendum)
[facility] SURGICAL PROGRESS NOTE (cpt [id number])  Hospital Day(s): 2.   Interval History: Patient seen and examined, no acute events or new complaints overnight. Patient reports that she is feeling improved compared to yesterday. Still with centralized abdominal soreness but this is improved compared to yesterday. Nausea and emesis resolved. Leukocytosis improved mildly to 30.2K, afebrile although is tachycardic. Mild but improved lactic acidosis to 2.2. She did get a mineral oil enema and magnesium citrate yesterday and has started to have bowel function. She returned to bathroom at the end of the interview.    Review of Systems:  Constitutional: denies fever, chills  HEENT: denies cough or congestion  Respiratory: denies any shortness of breath  Cardiovascular: denies chest pain or palpitations  Gastrointestinal: + abdominal pain (improved), denied N/V, or diarrhea/and bowel function as per interval history Genitourinary: denies burning with urination or urinary frequency Musculoskeletal: denies pain, decreased motor or sensation   Vital signs in last 24 hours: [min-max] current  Temp:  [98.1 F (36.7 C)-100.3 F (37.9 C)] 98.1 F (36.7 C) (03/17 0440) Pulse Rate:  [93-110] 110 (03/17 0008) Resp:  [16-17] 16 (03/17 0008) BP: (154-163)/(81-83) 163/83 (03/17 0008) SpO2:  [97 %-100 %] 97 % (03/17 0008)     Height: 5' 8"  (172.7 cm) Weight: 71.7 kg BMI (Calculated): 24.03   Intake/Output last 2 shifts:  03/16 0701 - 03/17 0700 In: 248.4 [P.O.:240; I.V.:1.6; IV Piggyback:6.8] Out: -    Physical Exam:  Constitutional: alert, cooperative and no distress  HENT: normocephalic without obvious abnormality  Eyes: PERRL, EOM's grossly intact and symmetric  Respiratory: breathing non-labored at rest  Cardiovascular: regular rate and sinus rhythm  Gastrointestinal: soft, mild central tenderness, distended, tympanic, no rebound/guarding, no peritonitis Musculoskeletal: no  edema or wounds, motor and sensation grossly intact, NT    Labs:  CBC Latest Ref Rng & Units 12/19/2019 12/18/2019 12/17/2019  WBC 4.0 - 10.5 K/uL 30.1(H) 32.8(H) 20.5(H)  Hemoglobin 12.0 - 15.0 g/dL 12.3 13.4 13.8  Hematocrit 36.0 - 46.0 % 36.2 38.8 40.5  Platelets 150 - 400 K/uL 263 300 292   CMP Latest Ref Rng & Units 12/17/2019 12/11/2019 11/24/2016  Glucose 70 - 99 mg/dL 176(H) 90 85  BUN 8 - 23 mg/dL 14 14 12   Creatinine 0.44 - 1.00 mg/dL 0.87 0.94 0.99  Sodium 135 - 145 mmol/L 139 145(H) 144  Potassium 3.5 - 5.1 mmol/L 3.6 5.6(H) 5.2  Chloride 98 - 111 mmol/L 106 105 103  CO2 22 - 32 mmol/L 22 26 -  Calcium 8.9 - 10.3 mg/dL 9.5 10.4(H) 9.7  Total Protein 6.5 - 8.1 g/dL 7.4 7.3 6.7  Total Bilirubin 0.3 - 1.2 mg/dL 1.3(H) 0.4 0.3  Alkaline Phos 38 - 126 U/L 92 114 96  AST 15 - 41 U/L 37 27 17  ALT 0 - 44 U/L 22 23 14      Imaging studies:   KUB (12/19/2019) personally reviewed which shows persistent colonic distension, and radiologist report pending:    Assessment/Plan: (ICD-10's: K52.9) 64 y.o. female with mild improvement in leukocytosis and clinical improvement in abdominal pain with return of bowel function attributable to acute colitis uncertain etiology without overt peritonitis on examination resulting in colonic ileus   - Would remain NPO today despite bowel function return    - IVF Resuscitation             - Agree with IV Abx (Zosyn)  - Morning KUB             -  Trend leukocytosis; lactic acid --> both mildly improved would continue to trend             - Collect stool studies  - Can consider repeating enemas or bowel regimen today             - Appreciate GI involvement and recommendations             - Further management per primary service    All of the above findings and recommendations were discussed with the patient, and the medical team, and all of patient's questions were answered to her expressed satisfaction.  -- Edison Simon, PA-C [facility] 12/19/2019, 7:17 AM [phone] M-F: 7am - 4pm

## 2019-12-19 NOTE — Progress Notes (Signed)
Aimee Stuart at [place] NAME: Aimee Stuart    MR#:  [id number]  DATE OF BIRTH:  [birth year]  SUBJECTIVE:  Tolerating ice chips.  Clinically improving, had small bowel movement earlier today, tachycardic REVIEW OF SYSTEMS:   Review of Systems  Constitutional: Positive for malaise/fatigue. Negative for chills, fever and weight loss.  HENT: Negative for ear discharge, ear pain and nosebleeds.   Eyes: Negative for blurred vision, pain and discharge.  Respiratory: Negative for sputum production, wheezing and stridor.   Cardiovascular: Negative for chest pain, palpitations, orthopnea and PND.  Gastrointestinal: Positive for abdominal pain. Negative for diarrhea.  Genitourinary: Negative for frequency and urgency.  Musculoskeletal: Negative for back pain and joint pain.  Neurological: Positive for weakness. Negative for sensory change, speech change and focal weakness.  Psychiatric/Behavioral: Negative for depression and hallucinations. The patient is not nervous/anxious.    Tolerating Diet:npo except ice chips Tolerating PT:   DRUG ALLERGIES:  No Known Allergies  VITALS:  Blood pressure (!) 163/83, pulse (!) 110, temperature 98.1 F (36.7 C), temperature source Oral, resp. rate 16, height 5' 8"  (1.727 m), weight 71.7 kg, SpO2 97 %.  PHYSICAL EXAMINATION:   Physical Exam  GENERAL:  64 y.o.-year-old patient lying in the bed with mild to mod acute distress.  EYES: Pupils equal, round, reactive to light and accommodation. No scleral icterus.   HEENT: Head atraumatic, normocephalic. Oropharynx and nasopharynx clear.  NECK:  Supple, no jugular venous distention. No thyroid enlargement, no tenderness.  LUNGS: Normal breath sounds bilaterally, no wheezing, rales, rhonchi. No use of accessory muscles of respiration.  CARDIOVASCULAR: S1, S2 normal. No murmurs, rubs, or gallops.  ABDOMEN: Soft, let LQ tender, nondistended. Bowel sounds present. No  organomegaly or mass.  EXTREMITIES: No cyanosis, clubbing or edema b/l.    NEUROLOGIC: Cranial nerves II through XII are intact. No focal Motor or sensory deficits b/l.   PSYCHIATRIC:  patient is alert and oriented x 3.  SKIN: No obvious rash, lesion, or ulcer.   LABORATORY PANEL:  CBC Recent Labs  Lab 12/19/19 0504  WBC 30.1*  HGB 12.3  HCT 36.2  PLT 263    Chemistries  Recent Labs  Lab 12/17/19 1309 12/17/19 1309 12/19/19 0750  NA 139   < > 135  K 3.6   < > 3.7  CL 106   < > 100  CO2 22   < > 26  GLUCOSE 176*   < > 146*  BUN 14   < > 22  CREATININE 0.87   < > 0.94  CALCIUM 9.5   < > 8.0*  MG  --   --  2.5*  AST 37  --   --   ALT 22  --   --   ALKPHOS 92  --   --   BILITOT 1.3*  --   --    < > = values in this interval not displayed.   Cardiac Enzymes No results for input(s): TROPONINI in the last 168 hours. RADIOLOGY:  CT ABDOMEN PELVIS W CONTRAST  Result Date: 12/17/2019 CLINICAL DATA:  64 year old female with history of nausea and vomiting. Suspected bowel obstruction. EXAM: CT ABDOMEN AND PELVIS WITH CONTRAST TECHNIQUE: Multidetector CT imaging of the abdomen and pelvis was performed using the standard protocol following bolus administration of intravenous contrast. CONTRAST:  167m OMNIPAQUE IOHEXOL 300 MG/ML  SOLN COMPARISON:  CT the abdomen and pelvis 04/26/2012. FINDINGS: Lower chest: Mild nodular scarring in  the inferior segment of the lingula. Small hiatal hernia. Hepatobiliary: No suspicious cystic or solid hepatic lesions. No intra or extrahepatic biliary ductal dilatation. Gallbladder is normal in appearance. Pancreas: No pancreatic mass. No pancreatic ductal dilatation. No pancreatic or peripancreatic fluid collections or inflammatory changes. Spleen: Unremarkable. Adrenals/Urinary Tract: Bilateral kidneys and bilateral adrenal glands are normal in appearance. No hydroureteronephrosis. Urinary bladder is normal in appearance. Stomach/Bowel: Intra-abdominal  portion of the stomach is normal. No pathologic dilatation of small bowel. There is some mural thickening in the colon involving predominantly the descending colon where there are subtle surrounding inflammatory changes and a trace volume of fluid in the left pericolic gutter, concerning for colitis. Mild gaseous distension is noted throughout much of the colon, particularly proximal to the descending colon. Normal appendix. Vascular/Lymphatic: Aortic atherosclerosis, without evidence of aneurysm or dissection in the abdominal or pelvic vasculature. No lymphadenopathy noted in the abdomen or pelvis. Reproductive: Status post hysterectomy. Ovaries are not confidently identified may be surgically absent or atrophic Other: Trace volume of fluid in the left pericolic gutter. No larger volume of ascites. No pneumoperitoneum. Musculoskeletal: There are no aggressive appearing lytic or blastic lesions noted in the visualized portions of the skeleton. IMPRESSION: 1. Mural thickening and inflammation associated with the descending colon, compatible with acute colitis. 2. Aortic atherosclerosis. 3. Additional incidental findings, as above. Electronically Signed   By: Vinnie Langton M.D.   On: 12/17/2019 16:03   DG Abd Portable 1V  Result Date: 12/19/2019 CLINICAL DATA:  Colitis EXAM: PORTABLE ABDOMEN - 1 VIEW COMPARISON:  Radiographs 04/20/2012 FINDINGS: Supine radiographs demonstrate gas-filled loops of colon at upper limits of normal measuring 7 cm in diameter. There is stool in the ascending colon. No bowel wall thickening identified. No small bowel distension. IMPRESSION: Gas distended colon without evidence of inflammation. Electronically Signed   By: Suzy Bouchard M.D.   On: 12/19/2019 08:33   ASSESSMENT AND PLAN:   [name]  is a 64 y.o. female with a known history of anxiety, depression, history of insomnia comes to the emergency room with acute onset severe nausea vomiting and left lower quadrant  abdominal pain  1. SIRS due to Acute colitis, left lower quadrant--POA -patient presented with nausea vomiting left lower quadrant abdominal pain -improving -elevated white count of 20.3-->32K->30K -BP 171/103, RR 24 - LA 2.9--repeat 4.8 -CT abdomen suggestive of acute colitis, descending Colon -N.p.o. except ice chips per surgery -Continue IV fluids -Continue IV zosyn -PRN Zofran  -IV PO and PRN pain meds - G.I. Dr [name] surgery following  2. Leukocytosis due to number one -continue to monitor, improving  3. History of depression/anxiety -Wellbutrin and Klonopin-- resume once able to start orals  4. History of smoking -smoking cessation counseling done  5. H/o Insomnia -takes trazodone qhs  6. DVT prophylaxis subcu Lovenox  7. Elevated BP w/o h/o HTN--likely due to pain with N/V -prn IV hydralazine -consider antihypertensives if BP remains elevated  8.Hyperglycemia--No h/o DM-2 -  A1c 5.1   Family Communication : Discussed with patient Consults :GI --Dr [name] Code Status :FULL code DVT prophylaxis :lovenox Discharge disposition: Home in 2-3/few days--still n.p.o. and needs to tolerate diet with improvement in her abdomen. based on surgical and GI input, will advance diet   TOTAL TIME TAKING CARE OF THIS PATIENT: *30* minutes.  >50% time spent on counselling and coordination of care  Note: This dictation was prepared with Dragon dictation along with smaller phrase technology. Any transcriptional errors that result from  this process are unintentional.  [name] M.D    [facility]   CC: Primary care physician; [name], MDPatient ID: Aimee Stuart, female   DOB: 06-Jan-1956, 64 y.o.   MRN: [id number]

## 2019-12-20 ENCOUNTER — Inpatient Hospital Stay: Payer: Managed Care, Other (non HMO)

## 2019-12-20 DIAGNOSIS — R112 Nausea with vomiting, unspecified: Secondary | ICD-10-CM

## 2019-12-20 DIAGNOSIS — F329 Major depressive disorder, single episode, unspecified: Secondary | ICD-10-CM

## 2019-12-20 DIAGNOSIS — F419 Anxiety disorder, unspecified: Secondary | ICD-10-CM

## 2019-12-20 LAB — CBC
HCT: 34.4 % — ABNORMAL LOW (ref 36.0–46.0)
Hemoglobin: 11.5 g/dL — ABNORMAL LOW (ref 12.0–15.0)
MCH: 29.2 pg (ref 26.0–34.0)
MCHC: 33.4 g/dL (ref 30.0–36.0)
MCV: 87.3 fL (ref 80.0–100.0)
Platelets: 229 10*3/uL (ref 150–400)
RBC: 3.94 MIL/uL (ref 3.87–5.11)
RDW: 13.3 % (ref 11.5–15.5)
WBC: 17.9 10*3/uL — ABNORMAL HIGH (ref 4.0–10.5)
nRBC: 0 % (ref 0.0–0.2)

## 2019-12-20 LAB — LACTIC ACID, PLASMA
Lactic Acid, Venous: 1.1 mmol/L (ref 0.5–1.9)
Lactic Acid, Venous: 1.4 mmol/L (ref 0.5–1.9)

## 2019-12-20 MED ORDER — ALPRAZOLAM 0.25 MG PO TABS: 0.2500 mg | ORAL_TABLET | Freq: Three times a day (TID) | ORAL | Status: DC | PRN

## 2019-12-20 MED ORDER — BUPROPION HCL ER (XL) 150 MG PO TB24
150.0000 mg | ORAL_TABLET | Freq: Every day | ORAL | Status: DC
  Administered 2019-12-22 – 2019-12-24 (×3): 150 mg via ORAL
  Filled 2019-12-20 (×4): qty 1

## 2019-12-20 MED ORDER — ALPRAZOLAM 0.25 MG PO TABS
0.2500 mg | ORAL_TABLET | Freq: Once | ORAL | Status: AC
  Administered 2019-12-20: 0.25 mg via ORAL
  Filled 2019-12-20: qty 1

## 2019-12-20 MED ORDER — CLONAZEPAM 0.5 MG PO TABS
0.2500 mg | ORAL_TABLET | Freq: Every day | ORAL | Status: DC
  Administered 2019-12-20 – 2019-12-23 (×4): 0.25 mg via ORAL
  Filled 2019-12-20 (×4): qty 1

## 2019-12-20 MED ORDER — POTASSIUM PHOSPHATES 15 MMOLE/5ML IV SOLN
20.0000 mmol | Freq: Once | INTRAVENOUS | Status: AC
  Administered 2019-12-20: 20 mmol via INTRAVENOUS
  Filled 2019-12-20: qty 6.67

## 2019-12-20 MED ORDER — TRAZODONE HCL 100 MG PO TABS
100.0000 mg | ORAL_TABLET | Freq: Every day | ORAL | Status: DC
  Administered 2019-12-20 – 2019-12-23 (×4): 100 mg via ORAL
  Filled 2019-12-20 (×4): qty 1

## 2019-12-20 MED ORDER — METOCLOPRAMIDE HCL 5 MG/ML IJ SOLN: 10.0000 mg | Freq: Four times a day (QID) | INTRAMUSCULAR | Status: DC | PRN

## 2019-12-20 MED ORDER — LORAZEPAM 2 MG/ML IJ SOLN
1.0000 mg | INTRAMUSCULAR | Status: DC | PRN
  Administered 2019-12-20 – 2019-12-22 (×5): 1 mg via INTRAVENOUS
  Filled 2019-12-20 (×5): qty 1

## 2019-12-20 NOTE — Plan of Care (Signed)
  Problem: Education: Goal: Knowledge of General Education information will improve Description: Including pain rating scale, medication(s)/side effects and non-pharmacologic comfort measures Outcome: Progressing   Problem: Health Behavior/Discharge Planning: Goal: Ability to manage health-related needs will improve Outcome: Progressing   Problem: Clinical Measurements: Goal: Ability to maintain clinical measurements within normal limits will improve Outcome: Progressing Goal: Will remain free from infection Outcome: Progressing Goal: Diagnostic test results will improve Outcome: Progressing Goal: Respiratory complications will improve Outcome: Progressing Goal: Cardiovascular complication will be avoided Outcome: Progressing   Problem: Activity: Goal: Risk for activity intolerance will decrease Outcome: Progressing   Problem: Nutrition: Goal: Adequate nutrition will be maintained Outcome: Progressing   Problem: Coping: Goal: Level of anxiety will decrease Outcome: Progressing   Problem: Elimination: Goal: Will not experience complications related to bowel motility Outcome: Progressing Goal: Will not experience complications related to urinary retention Outcome: Progressing   Problem: Pain Managment: Goal: General experience of comfort will improve Outcome: Progressing   Problem: Safety: Goal: Ability to remain free from injury will improve Outcome: Progressing   Problem: Skin Integrity: Goal: Risk for impaired skin integrity will decrease Outcome: Progressing   Problem: Bowel/Gastric: Goal: Occurences of nausea and/or vomiting will decrease Outcome: Progressing   Problem: Fluid Volume: Goal: Maintenance of adequate hydration will improve Outcome: Progressing   Problem: Nutritional: Goal: Achievement of adequate weight for body size and type will improve Outcome: Progressing

## 2019-12-20 NOTE — Progress Notes (Signed)
[facility] [place] NAME: Aimee Stuart    MR#:  [id number]  DATE OF BIRTH:  June 13, 1956  SUBJECTIVE:  Reports loose stool, nausea, anxiety. Afraid that she is getting worse inspite of assurance that objectively she seems to be improving. REVIEW OF SYSTEMS:   Review of Systems  Constitutional: Positive for malaise/fatigue. Negative for chills, fever and weight loss.  HENT: Negative for ear discharge, ear pain and nosebleeds.   Eyes: Negative for blurred vision, pain and discharge.  Respiratory: Negative for sputum production, wheezing and stridor.   Cardiovascular: Negative for chest pain, palpitations, orthopnea and PND.  Gastrointestinal: Positive for nausea. Negative for diarrhea.  Genitourinary: Negative for frequency and urgency.  Musculoskeletal: Negative for back pain and joint pain.  Neurological: Positive for weakness. Negative for sensory change, speech change and focal weakness.  Psychiatric/Behavioral: Negative for depression and hallucinations. The patient is not nervous/anxious.    Tolerating Diet:npo except ice chips Tolerating PT: ambulates without support DRUG ALLERGIES:  No Known Allergies  VITALS:  Blood pressure (!) 151/93, pulse 90, temperature 97.9 F (36.6 C), resp. rate 16, height 5' 8"  (1.727 m), weight 71.7 kg, SpO2 99 %.  PHYSICAL EXAMINATION:   Physical Exam  GENERAL:  64 y.o.-year-old patient lying in the bed with mild to mod acute distress.  EYES: Pupils equal, round, reactive to light and accommodation. No scleral icterus.   HEENT: Head atraumatic, normocephalic. Oropharynx and nasopharynx clear.  NECK:  Supple, no jugular venous distention. No thyroid enlargement, no tenderness.  LUNGS: Normal breath sounds bilaterally, no wheezing, rales, rhonchi. No use of accessory muscles of respiration.  CARDIOVASCULAR: S1, S2 normal. No murmurs, rubs, or gallops.  ABDOMEN: Soft,nontender, nondistended. Bowel  sounds present. No organomegaly or mass.  EXTREMITIES: No cyanosis, clubbing or edema b/l.    NEUROLOGIC: Cranial nerves II through XII are intact. No focal Motor or sensory deficits b/l.   PSYCHIATRIC:  patient is anxious SKIN: No obvious rash, lesion, or ulcer.   LABORATORY PANEL:  CBC Recent Labs  Lab 12/20/19 0307  WBC 17.9*  HGB 11.5*  HCT 34.4*  PLT 229    Chemistries  Recent Labs  Lab 12/17/19 1309 12/17/19 1309 12/19/19 0750  NA 139   < > 135  K 3.6   < > 3.7  CL 106   < > 100  CO2 22   < > 26  GLUCOSE 176*   < > 146*  BUN 14   < > 22  CREATININE 0.87   < > 0.94  CALCIUM 9.5   < > 8.0*  MG  --   --  2.5*  AST 37  --   --   ALT 22  --   --   ALKPHOS 92  --   --   BILITOT 1.3*  --   --    < > = values in this interval not displayed.   Cardiac Enzymes No results for input(s): TROPONINI in the last 168 hours. RADIOLOGY:  DG Abd 1 View  Result Date: 12/20/2019 CLINICAL DATA:  Acute onset of severe nausea and vomiting with LEFT lower quadrant pain on 12/16/2019 EXAM: ABDOMEN - 1 VIEW COMPARISON:  12/19/2019 FINDINGS: Scattered gas throughout nondistended colon to rectum. Mild stool RIGHT and transverse colon. Small bowel gas pattern normal. No bowel wall thickening or evidence of obstruction. Bones demineralized. No definite urinary tract calcifications. IMPRESSION: Nonobstructive bowel gas pattern. Electronically Signed   By: Elta Guadeloupe  Thornton Papas M.D.   On: 12/20/2019 08:27   DG Abd Portable 1V  Result Date: 12/19/2019 CLINICAL DATA:  Colitis EXAM: PORTABLE ABDOMEN - 1 VIEW COMPARISON:  Radiographs 04/20/2012 FINDINGS: Supine radiographs demonstrate gas-filled loops of colon at upper limits of normal measuring 7 cm in diameter. There is stool in the ascending colon. No bowel wall thickening identified. No small bowel distension. IMPRESSION: Gas distended colon without evidence of inflammation. Electronically Signed   By: Suzy Bouchard M.D.   On: 12/19/2019 08:33    ASSESSMENT AND PLAN:  Aimee Stuart  is a 64 y.o. female with a known history of anxiety, depression, history of insomnia comes to the emergency room with acute onset severe nausea vomiting and left lower quadrant abdominal pain  1. SIRS due to Acute colitis, left lower quadrant--POA -patient presented with nausea vomiting left lower quadrant abdominal pain -improving -elevated white count of 20.3-->32K->30K-> 17.9 -clinically seem to be improving although patient reports not same -CT abdomen suggestive of acute colitis, descending Colon - GI planning EGD tomorrow  2. Leukocytosis due to number one -continue to monitor, improving  3. History of depression/anxiety -Wellbutrin and Klonopin-- resumed - ativan IV prn  4. History of smoking -smoking cessation counseling done  5. H/o Insomnia - trazodone qhs  6. DVT prophylaxis subcu Lovenox  7. Elevated BP w/o h/o HTN--likely due to pain with N/V -prn IV hydralazine -consider antihypertensives if BP remains elevated  8.Hyperglycemia--No h/o DM-2 -  A1c 5.1   Family Communication : Updated patient's daughter [name] @ [phone] Consults :GI --Dr [name], Surgery Code Status :FULL code DVT prophylaxis :lovenox Discharge disposition: Home in 1-2 days Barriers - EGD planned for tomorrow, needs to make sure she is able to tolerate diet   TOTAL TIME TAKING CARE OF THIS PATIENT: *30* minutes.  >50% time spent on counselling and coordination of care  Note: This dictation was prepared with Dragon dictation along with smaller phrase technology. Any transcriptional errors that result from this process are unintentional.  [name] M.D    [facility]   CC: Primary care physician; [name], MDPatient ID: Aimee Stuart, female   DOB: 21-Oct-1955, 64 y.o.   MRN: [id number]

## 2019-12-20 NOTE — Progress Notes (Signed)
[facility] SURGICAL PROGRESS NOTE (cpt [phone])  Hospital Day(s): 3.   Interval History: Patient seen and examined, no acute events or new complaints overnight. Patient reports she is feeling much better. No fever, chills, nausea, or emesis. Abdominal pain and distension improved. Leukocytosis significantly improved this morning to 17K. She continues to have multiple BMs throughout the day yesterday. Still has significant lactic acidosis to 4.2 at 10AM yesterday  Review of Systems:  Constitutional: denies fever, chills  HEENT: denies cough or congestion  Respiratory: denies any shortness of breath  Cardiovascular: denies chest pain or palpitations  Gastrointestinal: + abdominal pain (soreness, improved), N/V, or diarrhea/and bowel function as per interval history Genitourinary: denies burning with urination or urinary frequency   Vital signs in last 24 hours: [min-max] current  Temp:  [98.2 F (36.8 C)-98.6 F (37 C)] 98.2 F (36.8 C) (03/17 2336) Pulse Rate:  [82-89] 82 (03/17 2336) Resp:  [16] 16 (03/17 2336) BP: (133-144)/(79-82) 144/82 (03/17 2336) SpO2:  [97 %-98 %] 97 % (03/17 2336)     Height: 5' 8"  (172.7 cm) Weight: 71.7 kg BMI (Calculated): 24.03   Intake/Output last 2 shifts:  03/17 0701 - 03/18 0700 In: 1795.1 [I.V.:1507.8; IV Piggyback:287.3] Out: -    Physical Exam:  Constitutional: alert, cooperative and no distress  HENT: normocephalic without obvious abnormality  Eyes: PERRL, EOM's grossly intact and symmetric  Respiratory: breathing non-labored at rest  Cardiovascular: regular rate and sinus rhythm  Gastrointestinal: soft, mild central tenderness, distention improved, tympanic, no rebound/guarding, no peritonitis Musculoskeletal: no edema or wounds, motor and sensation grossly intact, NT    Labs:  CBC Latest Ref Rng & Units 12/20/2019 12/19/2019 12/18/2019  WBC 4.0 - 10.5 K/uL 17.9(H) 30.1(H) 32.8(H)  Hemoglobin 12.0 - 15.0 g/dL 11.5(L) 12.3  13.4  Hematocrit 36.0 - 46.0 % 34.4(L) 36.2 38.8  Platelets 150 - 400 K/uL 229 263 300   CMP Latest Ref Rng & Units 12/19/2019 12/17/2019 12/11/2019  Glucose 70 - 99 mg/dL 146(H) 176(H) 90  BUN 8 - 23 mg/dL 22 14 14   Creatinine 0.44 - 1.00 mg/dL 0.94 0.87 0.94  Sodium 135 - 145 mmol/L 135 139 145(H)  Potassium 3.5 - 5.1 mmol/L 3.7 3.6 5.6(H)  Chloride 98 - 111 mmol/L 100 106 105  CO2 22 - 32 mmol/L 26 22 26   Calcium 8.9 - 10.3 mg/dL 8.0(L) 9.5 10.4(H)  Total Protein 6.5 - 8.1 g/dL - 7.4 7.3  Total Bilirubin 0.3 - 1.2 mg/dL - 1.3(H) 0.4  Alkaline Phos 38 - 126 U/L - 92 114  AST 15 - 41 U/L - 37 27  ALT 0 - 44 U/L - 22 23     Imaging studies:   KUB (12/20/2019) personally reviewed showing improvement, and radiologist report reviewed below:  IMPRESSION: Nonobstructive bowel gas pattern.   Assessment/Plan: (ICD-10's: K52.9) 64 y.o. female with improvement in leukocytosis and clinical improvement in abdominal pain with return of bowel function attributable to acute colitis uncertain etiology without overt peritonitis on examination resulting in colonic ileus   - Okay for CLD; NPO for any potential procedures from GI standpoint               - IVF Resuscitation - Agree with IV Abx (Zosyn) - Trend leukocytosis; lactic acid --> lactic acidosis to 4.2 yesterday, I will re-check this morning - Collect stool studies             - Can consider repeating enemas or bowel regimen today - Appreciate  GI involvement and recommendations - Further management per primary service   All of the above findings and recommendations were discussed with the patient, and the medical team, and all of patient's questions were answered to her expressed satisfaction.  -- Edison Simon, PA-C [facility] 12/20/2019, 7:26 AM [phone] M-F: 7am - 4pm

## 2019-12-20 NOTE — Plan of Care (Signed)
  Problem: Education: Goal: Knowledge of General Education information will improve Description: Including pain rating scale, medication(s)/side effects and non-pharmacologic comfort measures Outcome: Progressing   Problem: Health Behavior/Discharge Planning: Goal: Ability to manage health-related needs will improve Outcome: Progressing   Problem: Clinical Measurements: Goal: Ability to maintain clinical measurements within normal limits will improve Outcome: Progressing Goal: Will remain free from infection Outcome: Progressing Goal: Diagnostic test results will improve Outcome: Progressing Goal: Respiratory complications will improve Outcome: Progressing Goal: Cardiovascular complication will be avoided Outcome: Progressing   Problem: Activity: Goal: Risk for activity intolerance will decrease Outcome: Progressing   Problem: Nutrition: Goal: Adequate nutrition will be maintained Outcome: Not Progressing Note: Patient unable to tolerate food at this time.    Problem: Coping: Goal: Level of anxiety will decrease Outcome: Progressing   Problem: Pain Managment: Goal: General experience of comfort will improve Outcome: Progressing   Problem: Safety: Goal: Ability to remain free from injury will improve Outcome: Progressing   Problem: Fluid Volume: Goal: Maintenance of adequate hydration will improve Outcome: Progressing   Problem: Nutritional: Goal: Achievement of adequate weight for body size and type will improve Outcome: Progressing

## 2019-12-20 NOTE — Progress Notes (Signed)
Aimee Darby, MD [address]  [place], [place] [address]  Main: [phone]  Fax: [phone] Pager: [phone]   Subjective: Patient reports having bowel movements, significantly improved lower abdominal pain.  She states she is very anxious, nauseous and asking help to relieve her nausea and anxiety.  She has been dry heaving.  She reports mild epigastric discomfort.  She is just drinking Coke.  Her husband is bedside.  She reports that she has been on Wellbutrin and clonazepam as outpatient for anxiety which keeps it generally under control   Objective: Vital signs in last 24 hours: Vitals:   12/19/19 1801 12/19/19 2335 12/19/19 2336 12/20/19 0813  BP: 133/79 (!) 144/82 (!) 144/82 (!) 149/84  Pulse: 89 83 82 74  Resp: 16 16 16 18   Temp: 98.6 F (37 C) 98.2 F (36.8 C) 98.2 F (36.8 C) 97.9 F (36.6 C)  TempSrc:  Oral Oral Oral  SpO2: 98% 98% 97% 98%  Weight:      Height:       Weight change:   Intake/Output Summary (Last 24 hours) at 12/20/2019 1544 Last data filed at 12/20/2019 1413 Gross per 24 hour  Intake 1915.08 ml  Output -  Net 1915.08 ml     Exam: Heart:: Regular rate, regular rhythm Lungs: normal and clear to auscultation Abdomen: soft, nontender, less distended, normal bowel sounds   Lab Results: CBC Latest Ref Rng & Units 12/20/2019 12/19/2019 12/18/2019  WBC 4.0 - 10.5 K/uL 17.9(H) 30.1(H) 32.8(H)  Hemoglobin 12.0 - 15.0 g/dL 11.5(L) 12.3 13.4  Hematocrit 36.0 - 46.0 % 34.4(L) 36.2 38.8  Platelets 150 - 400 K/uL 229 263 300   CMP Latest Ref Rng & Units 12/19/2019 12/17/2019 12/11/2019  Glucose 70 - 99 mg/dL 146(H) 176(H) 90  BUN 8 - 23 mg/dL 22 14 14   Creatinine 0.44 - 1.00 mg/dL 0.94 0.87 0.94  Sodium 135 - 145 mmol/L 135 139 145(H)  Potassium 3.5 - 5.1 mmol/L 3.7 3.6 5.6(H)  Chloride 98 - 111 mmol/L 100 106 105  CO2 22 - 32 mmol/L 26 22 26   Calcium 8.9 - 10.3 mg/dL 8.0(L) 9.5 10.4(H)  Total Protein 6.5 - 8.1 g/dL - 7.4  7.3  Total Bilirubin 0.3 - 1.2 mg/dL - 1.3(H) 0.4  Alkaline Phos 38 - 126 U/L - 92 114  AST 15 - 41 U/L - 37 27  ALT 0 - 44 U/L - 22 23    Micro Results: Recent Results (from the past 240 hour(s))  SARS CORONAVIRUS 2 (TAT 6-24 HRS) Nasopharyngeal Nasopharyngeal Swab     Status: None   Collection Time: 12/17/19  4:48 PM   Specimen: Nasopharyngeal Swab  Result Value Ref Range Status   SARS Coronavirus 2 NEGATIVE NEGATIVE Final    Comment: (NOTE) SARS-CoV-2 target nucleic acids are NOT DETECTED. The SARS-CoV-2 RNA is generally detectable in upper and lower respiratory specimens during the acute phase of infection. Negative results do not preclude SARS-CoV-2 infection, do not rule out co-infections with other pathogens, and should not be used as the sole basis for treatment or other patient management decisions. Negative results must be combined with clinical observations, patient history, and epidemiological information. The expected result is Negative. Fact Sheet for Patients: SugarRoll.be Fact Sheet for Healthcare Providers: https://www.woods-mathews.com/ This test is not yet approved or cleared by the [place] FDA and  has been authorized for detection and/or diagnosis of SARS-CoV-2 by FDA under an Emergency Use Authorization (EUA). This  EUA will remain  in effect (meaning this test can be used) for the duration of the COVID-19 declaration under Section 56 4(b)(1) of the Act, 21 U.S.C. section 360bbb-3(b)(1), unless the authorization is terminated or revoked sooner. Performed at [facility], [place] [address] [place], [place] [address]    Studies/Results: DG Abd 1 View  Result Date: 12/20/2019 CLINICAL DATA:  Acute onset of severe nausea and vomiting with LEFT lower quadrant pain on 12/16/2019 EXAM: ABDOMEN - 1 VIEW COMPARISON:  12/19/2019 FINDINGS: Scattered gas throughout nondistended colon to rectum. Mild stool RIGHT and  transverse colon. Small bowel gas pattern normal. No bowel wall thickening or evidence of obstruction. Bones demineralized. No definite urinary tract calcifications. IMPRESSION: Nonobstructive bowel gas pattern. Electronically Signed   By: Lavonia Dana M.D.   On: 12/20/2019 08:27   DG Abd Portable 1V  Result Date: 12/19/2019 CLINICAL DATA:  Colitis EXAM: PORTABLE ABDOMEN - 1 VIEW COMPARISON:  Radiographs 04/20/2012 FINDINGS: Supine radiographs demonstrate gas-filled loops of colon at upper limits of normal measuring 7 cm in diameter. There is stool in the ascending colon. No bowel wall thickening identified. No small bowel distension. IMPRESSION: Gas distended colon without evidence of inflammation. Electronically Signed   By: Suzy Bouchard M.D.   On: 12/19/2019 08:33   Medications:  I have reviewed the patient's current medications. Prior to Admission:  Medications Prior to Admission  Medication Sig Dispense Refill Last Dose  . buPROPion (WELLBUTRIN XL) 150 MG 24 hr tablet Take 150 mg by mouth daily.   24+ hours at Unknown  . clonazePAM (KLONOPIN) 0.5 MG tablet Take 0.25 mg by mouth at bedtime as needed for anxiety.   Unknown at PRN  . fluticasone (FLONASE) 50 MCG/ACT nasal spray Place 1-2 sprays into both nostrils daily as needed for allergies or rhinitis.   Unknown at PRN  . Multiple Vitamin (MULTIVITAMIN WITH MINERALS) TABS tablet Take 1 tablet by mouth daily.     . traZODone (DESYREL) 100 MG tablet TAKE 1 TABLET BY MOUTH EVERYDAY AT BEDTIME (Patient taking differently: Take 100 mg by mouth at bedtime. ) 90 tablet 2 12+ hours at Unknown   Scheduled: . buPROPion  150 mg Oral Daily  . enoxaparin (LOVENOX) injection  40 mg Subcutaneous Q24H  . traZODone  100 mg Oral QHS   Continuous: . lactated ringers 125 mL/hr at 12/20/19 1439  . piperacillin-tazobactam (ZOSYN)  IV 3.375 g (12/20/19 0823)  . potassium PHOSPHATE IVPB (in mmol) 20 mmol (12/20/19 1441)   UEA:[id number] **OR**  acetaminophen, hydrALAZINE, ketorolac, LORazepam, metoCLOPramide (REGLAN) injection, morphine injection, ondansetron **OR** ondansetron (ZOFRAN) IV, promethazine, traMADol Anti-infectives (From admission, onward)   Start     Dose/Rate Route Frequency Ordered Stop   12/18/19 0800  piperacillin-tazobactam (ZOSYN) IVPB 3.375 g     3.375 g 12.5 mL/hr over 240 Minutes Intravenous Every 8 hours 12/18/19 0739     12/18/19 0600  ciprofloxacin (CIPRO) IVPB 400 mg  Status:  Discontinued     400 mg 200 mL/hr over 60 Minutes Intravenous Every 12 hours 12/17/19 1654 12/18/19 0737   12/18/19 0000  metroNIDAZOLE (FLAGYL) IVPB 500 mg  Status:  Discontinued    Note to Pharmacy: Adjust the time for next 8 hour dosing   500 mg 100 mL/hr over 60 Minutes Intravenous Every 8 hours 12/17/19 1646 12/18/19 0737   12/17/19 1645  metroNIDAZOLE (FLAGYL) IVPB 500 mg  Status:  Discontinued     500 mg 100 mL/hr over 60 Minutes Intravenous  Every 8 hours 12/17/19 1644 12/17/19 1646   12/17/19 1615  metroNIDAZOLE (FLAGYL) IVPB 500 mg     500 mg 100 mL/hr over 60 Minutes Intravenous  Once 12/17/19 1611 12/17/19 1950   12/17/19 1615  ciprofloxacin (CIPRO) IVPB 400 mg     400 mg 200 mL/hr over 60 Minutes Intravenous  Once 12/17/19 1611 12/17/19 1823     Scheduled Meds: . buPROPion  150 mg Oral Daily  . enoxaparin (LOVENOX) injection  40 mg Subcutaneous Q24H  . traZODone  100 mg Oral QHS   Continuous Infusions: . lactated ringers 125 mL/hr at 12/20/19 1439  . piperacillin-tazobactam (ZOSYN)  IV 3.375 g (12/20/19 0823)  . potassium PHOSPHATE IVPB (in mmol) 20 mmol (12/20/19 1441)   PRN Meds:.acetaminophen **OR** acetaminophen, hydrALAZINE, ketorolac, LORazepam, metoCLOPramide (REGLAN) injection, morphine injection, ondansetron **OR** ondansetron (ZOFRAN) IV, promethazine, traMADol   Assessment: Active Problems:   Acute colitis  Intractable nausea, anxiety  Plan: Acute left-sided colitis of unclear etiology  Significant improvement, leukocytosis has improved from 30 K to 17 K Monitor electrolytes closely and replete as needed Advance diet as tolerated Perform stool studies to rule out infection Patient is currently on empiric antibiotics, recommend for 5-7 days total Due to intractable nausea, patient will not be able to tolerate bowel prep.  Therefore, do not recommend colonoscopy as inpatient.  This can be pursued as outpatient  Intractable nausea: Most likely secondary to uncontrolled anxiety Patient is started on her outpatient antianxiety medications today Increase Reglan to 10 mg every 6-8 hours, alternating with Zofran and Phenergan Check H. pylori IgG N.p.o. past midnight We will perform EGD tomorrow Patient is agreeable with the plan   LOS: 3 days   Mersadez Linden 12/20/2019, 3:44 PM

## 2019-12-21 ENCOUNTER — Inpatient Hospital Stay: Payer: Managed Care, Other (non HMO) | Admitting: Registered Nurse

## 2019-12-21 ENCOUNTER — Encounter
Admission: EM | Disposition: A | Discharge status: Home / Self Care | Payer: Self-pay | Source: Home / Self Care | Attending: Internal Medicine

## 2019-12-21 ENCOUNTER — Encounter: Payer: Self-pay | Admitting: Internal Medicine

## 2019-12-21 DIAGNOSIS — K269 Duodenal ulcer, unspecified as acute or chronic, without hemorrhage or perforation: Secondary | ICD-10-CM

## 2019-12-21 DIAGNOSIS — E876 Hypokalemia: Secondary | ICD-10-CM

## 2019-12-21 HISTORY — PX: FLEXIBLE SIGMOIDOSCOPY: SHX5431

## 2019-12-21 HISTORY — PX: ESOPHAGOGASTRODUODENOSCOPY: SHX5428

## 2019-12-21 LAB — CBC
HCT: 38.4 % (ref 36.0–46.0)
Hemoglobin: 13 g/dL (ref 12.0–15.0)
MCH: 29.3 pg (ref 26.0–34.0)
MCHC: 33.9 g/dL (ref 30.0–36.0)
MCV: 86.7 fL (ref 80.0–100.0)
Platelets: 319 10*3/uL (ref 150–400)
RBC: 4.43 MIL/uL (ref 3.87–5.11)
RDW: 13.2 % (ref 11.5–15.5)
WBC: 17.4 10*3/uL — ABNORMAL HIGH (ref 4.0–10.5)
nRBC: 0 % (ref 0.0–0.2)

## 2019-12-21 LAB — BASIC METABOLIC PANEL
Anion gap: 11 (ref 5–15)
BUN: 8 mg/dL (ref 8–23)
CO2: 26 mmol/L (ref 22–32)
Calcium: 8.5 mg/dL — ABNORMAL LOW (ref 8.9–10.3)
Chloride: 100 mmol/L (ref 98–111)
Creatinine, Ser: 0.72 mg/dL (ref 0.44–1.00)
GFR calc Af Amer: >60 mL/min (ref >60)
GFR calc non Af Amer: >60 mL/min (ref >60)
Glucose, Bld: 108 mg/dL — ABNORMAL HIGH (ref 70–99)
Potassium: 3.1 mmol/L — ABNORMAL LOW (ref 3.5–5.1)
Sodium: 137 mmol/L (ref 135–145)

## 2019-12-21 LAB — H PYLORI, IGM, IGG, IGA AB
H Pylori IgG: 0.08 Index Value (ref 0.00–0.79)
H. Pylogi, Iga Abs: <9 units (ref 0.0–8.9)
H. Pylogi, Igm Abs: <9 units (ref 0.0–8.9)

## 2019-12-21 LAB — PHOSPHORUS: Phosphorus: 1.9 mg/dL — ABNORMAL LOW (ref 2.5–4.6)

## 2019-12-21 LAB — MAGNESIUM: Magnesium: 2.2 mg/dL (ref 1.7–2.4)

## 2019-12-21 LAB — SURGICAL PCR SCREEN
MRSA, PCR: NEGATIVE
Staphylococcus aureus: NEGATIVE

## 2019-12-21 SURGERY — EGD (ESOPHAGOGASTRODUODENOSCOPY)
Anesthesia: Monitor Anesthesia Care

## 2019-12-21 SURGERY — SIGMOIDOSCOPY, FLEXIBLE
Anesthesia: General

## 2019-12-21 MED ORDER — PROPOFOL 10 MG/ML IV BOLUS
INTRAVENOUS | Status: DC | PRN
  Administered 2019-12-21: 70 mg via INTRAVENOUS

## 2019-12-21 MED ORDER — KETAMINE HCL 10 MG/ML IJ SOLN
INTRAMUSCULAR | Status: DC | PRN
  Administered 2019-12-21: 20 mg via INTRAVENOUS

## 2019-12-21 MED ORDER — POTASSIUM CHLORIDE CRYS ER 20 MEQ PO TBCR
40.0000 meq | EXTENDED_RELEASE_TABLET | Freq: Once | ORAL | Status: DC
  Filled 2019-12-21: qty 2

## 2019-12-21 MED ORDER — ONDANSETRON HCL 4 MG/2ML IJ SOLN: 4.0000 mg | Freq: Once | INTRAMUSCULAR | Status: AC | PRN

## 2019-12-21 MED ORDER — DEXAMETHASONE SODIUM PHOSPHATE 4 MG/ML IJ SOLN
INTRAMUSCULAR | Status: AC
  Filled 2019-12-21: qty 1

## 2019-12-21 MED ORDER — DEXMEDETOMIDINE HCL 200 MCG/2ML IV SOLN
INTRAVENOUS | Status: DC | PRN
  Administered 2019-12-21: 16 ug via INTRAVENOUS

## 2019-12-21 MED ORDER — POTASSIUM PHOSPHATES 15 MMOLE/5ML IV SOLN
20.0000 mmol | Freq: Once | INTRAVENOUS | Status: AC
  Administered 2019-12-21: 20 mmol via INTRAVENOUS
  Filled 2019-12-21: qty 6.67

## 2019-12-21 MED ORDER — FENTANYL CITRATE (PF) 100 MCG/2ML IJ SOLN: 25.0000 ug | INTRAMUSCULAR | Status: DC | PRN

## 2019-12-21 MED ORDER — ONDANSETRON HCL 4 MG/2ML IJ SOLN: 4.0000 mg | Freq: Once | INTRAMUSCULAR | Status: DC

## 2019-12-21 MED ORDER — PROPOFOL 500 MG/50ML IV EMUL
INTRAVENOUS | Status: DC | PRN
  Administered 2019-12-21: 150 ug/kg/min via INTRAVENOUS

## 2019-12-21 MED ORDER — SODIUM CHLORIDE 0.9 % IV SOLN: INTRAVENOUS | Status: DC

## 2019-12-21 MED ORDER — LIDOCAINE HCL (CARDIAC) PF 100 MG/5ML IV SOSY
PREFILLED_SYRINGE | INTRAVENOUS | Status: DC | PRN
  Administered 2019-12-21: 80 mg via INTRAVENOUS

## 2019-12-21 MED ORDER — ONDANSETRON HCL 4 MG/2ML IJ SOLN
INTRAMUSCULAR | Status: AC
  Filled 2019-12-21: qty 2

## 2019-12-21 MED ORDER — DEXAMETHASONE SODIUM PHOSPHATE 10 MG/ML IJ SOLN
INTRAMUSCULAR | Status: DC | PRN
  Administered 2019-12-21: 4 mg via INTRAVENOUS

## 2019-12-21 MED ORDER — MAGNESIUM CITRATE PO SOLN
1.0000 | Freq: Once | ORAL | Status: AC
  Administered 2019-12-21: 1 via ORAL
  Filled 2019-12-21: qty 296

## 2019-12-21 MED ORDER — SODIUM CHLORIDE (PF) 0.9 % IJ SOLN
INTRAMUSCULAR | Status: AC
  Filled 2019-12-21: qty 10

## 2019-12-21 MED ORDER — PANTOPRAZOLE SODIUM 40 MG IV SOLR
40.0000 mg | Freq: Two times a day (BID) | INTRAVENOUS | Status: DC
  Administered 2019-12-21 – 2019-12-23 (×4): 40 mg via INTRAVENOUS
  Filled 2019-12-21 (×4): qty 40

## 2019-12-21 MED ORDER — ONDANSETRON HCL 4 MG/2ML IJ SOLN
INTRAMUSCULAR | Status: AC
  Administered 2019-12-21: 4 mg via INTRAVENOUS
  Filled 2019-12-21: qty 2

## 2019-12-21 NOTE — Progress Notes (Signed)
[facility] SURGICAL PROGRESS NOTE (cpt [phone])  Hospital Day(s): 4.   Post op day(s):  Marland Kitchen   Interval History: Patient seen and examined, no acute events or new complaints overnight. Patient reports she feels better. She is still having some abdominal soreness primarily in her upper abdomen but this is mild. Distension remains improved. She continues to have bowel function. No reported nausea or emesis this morning. Mild hypokalemia to 3.1, hypophosphatemia to 1.9, Leukocytosis stable at 17.4K, no fevers. Plan for EGD with GI today.   Review of Systems:  Constitutional: denies fever, chills  HEENT: denies cough or congestion  Respiratory: denies any shortness of breath  Cardiovascular: denies chest pain or palpitations  Gastrointestinal: denies abdominal pain, N/V, or diarrhea/and bowel function as per interval history Genitourinary: denies burning with urination or urinary frequency   Vital signs in last 24 hours: [min-max] current  Temp:  [97.9 F (36.6 C)-98.1 F (36.7 C)] 98.1 F (36.7 C) (03/19 0250) Pulse Rate:  [74-95] 95 (03/19 0250) Resp:  [16-18] 18 (03/19 0250) BP: (149-158)/(84-94) 158/94 (03/19 0250) SpO2:  [98 %-99 %] 98 % (03/19 0250)     Height: 5' 8"  (172.7 cm) Weight: 71.7 kg BMI (Calculated): 24.03   Intake/Output last 2 shifts:  03/18 0701 - 03/19 0700 In: 923.8 [P.O.:120; I.V.:578.4; IV Piggyback:225.5] Out: -    Physical Exam:  Constitutional: alert, cooperative and no distress  HENT: normocephalic without obvious abnormality  Eyes: PERRL, EOM's grossly intact and symmetric  Respiratory: breathing non-labored at rest  Cardiovascular: regular rate and sinus rhythm  Gastrointestinal: soft,non-tender, no distended, no rebound/guarding, no peritonitis Musculoskeletal: no edema or wounds, motor and sensation grossly intact, NT   Labs:  CBC Latest Ref Rng & Units 12/21/2019 12/20/2019 12/19/2019  WBC 4.0 - 10.5 K/uL 17.4(H) 17.9(H) 30.1(H)   Hemoglobin 12.0 - 15.0 g/dL 13.0 11.5(L) 12.3  Hematocrit 36.0 - 46.0 % 38.4 34.4(L) 36.2  Platelets 150 - 400 K/uL 319 229 263   CMP Latest Ref Rng & Units 12/21/2019 12/19/2019 12/17/2019  Glucose 70 - 99 mg/dL 108(H) 146(H) 176(H)  BUN 8 - 23 mg/dL 8 22 14   Creatinine 0.44 - 1.00 mg/dL 0.72 0.94 0.87  Sodium 135 - 145 mmol/L 137 135 139  Potassium 3.5 - 5.1 mmol/L 3.1(L) 3.7 3.6  Chloride 98 - 111 mmol/L 100 100 106  CO2 22 - 32 mmol/L 26 26 22   Calcium 8.9 - 10.3 mg/dL 8.5(L) 8.0(L) 9.5  Total Protein 6.5 - 8.1 g/dL - - 7.4  Total Bilirubin 0.3 - 1.2 mg/dL - - 1.3(H)  Alkaline Phos 38 - 126 U/L - - 92  AST 15 - 41 U/L - - 37  ALT 0 - 44 U/L - - 22     Imaging studies: No new pertinent imaging studies   Assessment/Plan: (ICD-10's: K52.9) 64 y.o. female with improvement in leukocytosisand clinical improvement in abdominal pain with return of bowel functionattributable to acute colitis uncertain etiology without overt peritonitis on examinationresulting in colonic ileus   - NPO for GI procedure; should be okay to advance diet as tolerates following procedure    - No indication for surgical intervention  - Continue IV Abx (Zosyn)  - Trend leukocytosis  - pain control prn; antiemetics prn  - monitor abdominal examination; on-going bowel function   - mobilize as tolerates  - Appreciate GI involvement and recommendations - Further management per primary service   All of the above findings and recommendations were discussed with the patient,  and the medical team, and all of patient's questions were answered to her expressed satisfaction.  -- Edison Simon, PA-C [facility] 12/21/2019, 7:07 AM [phone] M-F: 7am - 4pm

## 2019-12-21 NOTE — Anesthesia Preprocedure Evaluation (Addendum)
Anesthesia Evaluation  Patient identified by MRN, date of birth, ID band Patient awake    Reviewed: Allergy & Precautions, NPO status , Patient's Chart, lab work & pertinent test results  Airway Mallampati: II  TM Distance: >3 FB Neck ROM: Full    Dental no notable dental hx.    Pulmonary Current Smoker, former smoker,    Pulmonary exam normal        Cardiovascular negative cardio ROS Normal cardiovascular exam     Neuro/Psych PSYCHIATRIC DISORDERS Anxiety Depression negative neurological ROS     GI/Hepatic negative GI ROS, Neg liver ROS,   Endo/Other  negative endocrine ROS  Renal/GU Renal InsufficiencyRenal disease     Musculoskeletal negative musculoskeletal ROS (+)   Abdominal   Peds negative pediatric ROS (+)  Hematology negative hematology ROS (+)   Anesthesia Other Findings   Reproductive/Obstetrics                            Anesthesia Physical  Anesthesia Plan  ASA: II  Anesthesia Plan: MAC   Post-op Pain Management:    Induction: Intravenous  PONV Risk Score and Plan:   Airway Management Planned: Nasal Cannula  Additional Equipment:   Intra-op Plan:   Post-operative Plan:   Informed Consent: I have reviewed the patients History and Physical, chart, labs and discussed the procedure including the risks, benefits and alternatives for the proposed anesthesia with the patient or authorized representative who has indicated his/her understanding and acceptance.       Plan Discussed with: CRNA  Anesthesia Plan Comments:         Anesthesia Quick Evaluation

## 2019-12-21 NOTE — Progress Notes (Signed)
EGD and flexible sigmoidoscopy post procedure note  EGD revealed superficial, several duodenal bulb ulcers concerning for ischemia Gastric biopsies performed to evaluate for H. pylori Start Protonix 40 mg IV twice daily Okay to add sucralfate if needed Advance diet as tolerated  Flexible sigmoidoscopy revealed severe, ulcerated left-sided colitis beginning from sigmoid colon to the descending colon, continuous ulceration, with no bleeding.  These features are highly consistent with ischemic colitis Biopsies performed to evaluate for IBD or ischemic colitis Switch to Cipro and Flagyl Advance diet as tolerated Close follow-up with me in 2 to 3 weeks Patient will need colonoscopy as outpatient Hold off on any steroids at this time unless pathology confirms IBD Complete abstinence from smoking tobacco Dr. Alice Reichert will cover this weekend  [name], MD [facility]  [place], [place] [address]  Main: [phone]  Fax: [phone] Pager: [phone]

## 2019-12-21 NOTE — OR Nursing (Signed)
1000 ml 0.9% NS now running in 22 gauge IV catheter in right wrist.

## 2019-12-21 NOTE — Transfer of Care (Signed)
Immediate Anesthesia Transfer of Care Note  Patient: Aimee Stuart  Procedure(s) Performed: ESOPHAGOGASTRODUODENOSCOPY (EGD) (N/A ) FLEXIBLE SIGMOIDOSCOPY (N/A )  Patient Location: Endoscopy Unit  Anesthesia Type:General  Level of Consciousness: drowsy  Airway & Oxygen Therapy: Patient Spontanous Breathing  Post-op Assessment: Report given to RN and Post -op Vital signs reviewed and stable  Post vital signs: Reviewed and stable  Last Vitals:  Vitals Value Taken Time  BP 88/61 12/21/19 1537  Temp 36.2 C 12/21/19 1536  Pulse 68 12/21/19 1541  Resp 13 12/21/19 1541  SpO2 97 % 12/21/19 1541  Vitals shown include unvalidated device data.  Last Pain:  Vitals:   12/21/19 1536  TempSrc: Temporal  PainSc: 0-No pain         Complications: No apparent anesthesia complications

## 2019-12-21 NOTE — Op Note (Signed)
[facility] Gastroenterology Patient Name: Aimee Stuart Procedure Date: 12/21/2019 2:48 PM MRN: [id number] Account #: [id number] Date of Birth: [birth year] Admit Type: Inpatient Age: 64 Room: [facility] ENDO ROOM 3 Gender: Female Note Status: Finalized Procedure:             Flexible Sigmoidoscopy Indications:           Abdominal pain in the left lower quadrant, Abdominal                         pain in the left upper quadrant, Abnormal CT of the GI                         tract Providers:             [name] MD, MD Medicines:             Monitored Anesthesia Care Complications:         No immediate complications. Estimated blood loss:                         Minimal. Procedure:             Pre-Anesthesia Assessment:                        - Prior to the procedure, a History and Physical was                         performed, and patient medications and allergies were                         reviewed. The patient is competent. The risks and                         benefits of the procedure and the sedation options and                         risks were discussed with the patient. All questions                         were answered and informed consent was obtained.                         Patient identification and proposed procedure were                         verified by the physician, the nurse, the                         anesthesiologist, the anesthetist and the technician                         in the pre-procedure area in the procedure room in the                         endoscopy suite. Mental Status Examination: alert and                         oriented. Airway Examination: normal oropharyngeal  airway and neck mobility. Respiratory Examination:                         clear to auscultation. CV Examination: normal.                         Prophylactic Antibiotics: The patient does not require   prophylactic antibiotics. Prior Anticoagulants: The                         patient has taken no previous anticoagulant or                         antiplatelet agents. ASA Grade Assessment: II - A                         patient with mild systemic disease. After reviewing                         the risks and benefits, the patient was deemed in                         satisfactory condition to undergo the procedure. The                         anesthesia plan was to use monitored anesthesia care                         (MAC). Immediately prior to administration of                         medications, the patient was re-assessed for adequacy                         to receive sedatives. The heart rate, respiratory                         rate, oxygen saturations, blood pressure, adequacy of                         pulmonary ventilation, and response to care were                         monitored throughout the procedure. The physical                         status of the patient was re-assessed after the                         procedure.                        After obtaining informed consent, the scope was passed                         under direct vision. The Endoscope was introduced                         through the anus and advanced to the the descending  colon. The flexible sigmoidoscopy was performed with                         moderate difficulty due to restricted mobility of the                         colon. Successful completion of the procedure was                         aided by changing the patient to a supine position and                         applying abdominal pressure. The patient tolerated the                         procedure well. The quality of the bowel preparation                         was good. Findings:      The perianal and digital rectal examinations were normal. Pertinent       negatives include normal sphincter tone and no  palpable rectal lesions.      Tight rectosigmoid area      A continuous area of nonbleeding ulcerated mucosa with no stigmata of       recent bleeding was present in the sigmoid colon and in the descending       colon. Biopsies were taken with a cold forceps for histology to evaluate       for ischemic colitis or IBD.      Normal mucosa was found in the rectum, rectosigmoid and in the proximal       descending colon. Biopsies from normal appearing mucosa in proximal       descending colon were taken with a cold forceps for histology. Impression:            - Mucosal ulceration. Biopsied.                        - Normal mucosa in the rectum and in the proximal                         descending colon. Biopsied. Recommendation:        - Return patient to hospital ward for ongoing care.                        - Clear liquid diet today.                        - Await pathology results.                        - Continue present medications. Procedure Code(s):     --- Professional ---                        [phone], Sigmoidoscopy, flexible; with biopsy, single or                         multiple Diagnosis Code(s):     --- Professional ---  K63.3, Ulcer of intestine                        R10.32, Left lower quadrant pain                        R93.3, Abnormal findings on diagnostic imaging of                         other parts of digestive tract                        R10.12, Left upper quadrant pain CPT copyright 2019 American Medical Association. All rights reserved. The codes documented in this report are preliminary and upon coder review may  be revised to meet current compliance requirements. Dr. Ulyess Mort [name] MD, MD 12/21/2019 3:38:15 PM This report has been signed electronically. Number of Addenda: 0 Note Initiated On: 12/21/2019 2:48 PM Total Procedure Duration: 0 hours 21 minutes 18 seconds  Estimated Blood Loss:  Estimated blood loss was  minimal.      [facility]

## 2019-12-21 NOTE — Plan of Care (Signed)
  Problem: Education: Goal: Knowledge of General Education information will improve Description: Including pain rating scale, medication(s)/side effects and non-pharmacologic comfort measures Outcome: Progressing   Problem: Clinical Measurements: Goal: Ability to maintain clinical measurements within normal limits will improve Outcome: Progressing Goal: Will remain free from infection Outcome: Progressing Goal: Diagnostic test results will improve Outcome: Progressing Goal: Respiratory complications will improve Outcome: Progressing Goal: Cardiovascular complication will be avoided Outcome: Progressing   Problem: Activity: Goal: Risk for activity intolerance will decrease Outcome: Progressing   Problem: Pain Managment: Goal: General experience of comfort will improve Outcome: Progressing   Problem: Safety: Goal: Ability to remain free from injury will improve Outcome: Progressing   Problem: Skin Integrity: Goal: Risk for impaired skin integrity will decrease Outcome: Progressing

## 2019-12-21 NOTE — Anesthesia Postprocedure Evaluation (Signed)
Anesthesia Post Note  Patient: Aimee Stuart  Procedure(s) Performed: ESOPHAGOGASTRODUODENOSCOPY (EGD) (N/A ) FLEXIBLE SIGMOIDOSCOPY (N/A )  Patient location during evaluation: Endoscopy Anesthesia Type: MAC Level of consciousness: awake and alert Pain management: pain level controlled Vital Signs Assessment: post-procedure vital signs reviewed and stable Respiratory status: spontaneous breathing, nonlabored ventilation, respiratory function stable and patient connected to nasal cannula oxygen Cardiovascular status: blood pressure returned to baseline and stable Postop Assessment: no apparent nausea or vomiting Anesthetic complications: no     Last Vitals:  Vitals:   12/21/19 1626 12/21/19 1709  BP: 139/87 (!) 172/100  Pulse: 77 75  Resp: 17 16  Temp:    SpO2: 98% 98%    Last Pain:  Vitals:   12/21/19 1709  TempSrc:   PainSc: 0-No pain                 Precious Haws Hillary Struss

## 2019-12-21 NOTE — Op Note (Signed)
[facility] Gastroenterology Patient Name: Aimee Stuart Procedure Date: 12/21/2019 2:49 PM MRN: [id number] Account #: [id number] Date of Birth: [birth year] Admit Type: Inpatient Age: 64 Room: [facility] ENDO ROOM 3 Gender: Female Note Status: Finalized Procedure:             Upper GI endoscopy Indications:           Epigastric abdominal pain, Nausea with vomiting Providers:             [name] MD, MD Medicines:             Monitored Anesthesia Care Complications:         No immediate complications. Estimated blood loss: None. Procedure:             Pre-Anesthesia Assessment:                        - Prior to the procedure, a History and Physical was                         performed, and patient medications and allergies were                         reviewed. The patient is competent. The risks and                         benefits of the procedure and the sedation options and                         risks were discussed with the patient. All questions                         were answered and informed consent was obtained.                         Patient identification and proposed procedure were                         verified by the physician, the nurse, the                         anesthesiologist, the anesthetist and the technician                         in the pre-procedure area in the procedure room in the                         endoscopy suite. Mental Status Examination: alert and                         oriented. Airway Examination: normal oropharyngeal                         airway and neck mobility. Respiratory Examination:                         clear to auscultation. CV Examination: normal.                         Prophylactic Antibiotics: The  patient does not require                         prophylactic antibiotics. Prior Anticoagulants: The                         patient has taken no previous anticoagulant or                          antiplatelet agents. ASA Grade Assessment: II - A                         patient with mild systemic disease. After reviewing                         the risks and benefits, the patient was deemed in                         satisfactory condition to undergo the procedure. The                         anesthesia plan was to use monitored anesthesia care                         (MAC). Immediately prior to administration of                         medications, the patient was re-assessed for adequacy                         to receive sedatives. The heart rate, respiratory                         rate, oxygen saturations, blood pressure, adequacy of                         pulmonary ventilation, and response to care were                         monitored throughout the procedure. The physical                         status of the patient was re-assessed after the                         procedure.                        After obtaining informed consent, the endoscope was                         passed under direct vision. Throughout the procedure,                         the patient's blood pressure, pulse, and oxygen                         saturations were monitored continuously. The Endoscope  was introduced through the mouth, and advanced to the                         second part of duodenum. The upper GI endoscopy was                         accomplished without difficulty. The patient tolerated                         the procedure well. Findings:      Few non-bleeding, atleast 4 linear and superficial duodenal ulcers with       a clean ulcer base (Forrest Class III) were found in the duodenal bulb.       The largest lesion was 20 mm in largest dimension.      The second portion of the duodenum was normal.      A small hiatal hernia was present.      The entire examined stomach was normal. Biopsies were taken with a cold       forceps for Helicobacter pylori  testing.      LA Grade A (one or more mucosal breaks less than 5 mm, not extending       between tops of 2 mucosal folds) esophagitis with no bleeding was found       in the lower third of the esophagus. Impression:            - Non-bleeding duodenal ulcers with a clean ulcer base                         (Forrest Class III).                        - Normal second portion of the duodenum.                        - Small hiatal hernia.                        - Normal stomach. Biopsied.                        - LA Grade A reflux esophagitis with no bleeding. Recommendation:        - Use Protonix (pantoprazole) 40 mg PO BID for 3                         months.                        - Perform an H. pylori serology.                        - Return to GI clinic in 1 month.                        - Await pathology results.                        - Check serum gastric levels if H Pylori is negative Procedure Code(s):     --- Professional ---  62563, Esophagogastroduodenoscopy, flexible,                         transoral; with biopsy, single or multiple Diagnosis Code(s):     --- Professional ---                        K26.9, Duodenal ulcer, unspecified as acute or                         chronic, without hemorrhage or perforation                        K44.9, Diaphragmatic hernia without obstruction or                         gangrene                        K21.00, Gastro-esophageal reflux disease with                         esophagitis, without bleeding                        R10.13, Epigastric pain                        R11.2, Nausea with vomiting, unspecified CPT copyright 2019 American Medical Association. All rights reserved. The codes documented in this report are preliminary and upon coder review may  be revised to meet current compliance requirements. Dr. Ulyess Mort [name] MD, MD 12/21/2019 3:10:02 PM This report has been signed  electronically. Number of Addenda: 0 Note Initiated On: 12/21/2019 2:49 PM Estimated Blood Loss:  Estimated blood loss: none.      [facility]

## 2019-12-21 NOTE — Consult Note (Signed)
Pharmacy Antibiotic Note  Aimee Stuart is a 64 y.o. female admitted on 12/17/2019 with intra-abdominal infection.  Pharmacy has been consulted for Zosyn dosing. Her renal function appears to be at baseline and un-impaired.  Plan:     Day 4  Zosyn 3.75 g Q8H - extended infusion.     Height: 5' 8"  (172.7 cm) Weight: 158 lb (71.7 kg) IBW/kg (Calculated) : 63.9  Temp (24hrs), Avg:98.2 F (36.8 C), Min:97.9 F (36.6 C), Max:98.5 F (36.9 C)  Recent Labs  Lab 12/17/19 1309 12/17/19 1619 12/18/19 0404 12/19/19 0504 12/19/19 0750 12/19/19 1020 12/20/19 0307 12/20/19 0922 12/20/19 1214 12/21/19 0321  WBC 20.5*  --  32.8* 30.1*  --   --  17.9*  --   --  17.4*  CREATININE 0.87  --   --   --  0.94  --   --   --   --  0.72  LATICACIDVEN  --    < > 2.5*  --  2.2* 4.8*  --  1.1 1.4  --    < > = values in this interval not displayed.    Estimated Creatinine Clearance: 72.6 mL/min (by C-G formula based on SCr of 0.72 mg/dL).    No Known Allergies  Antimicrobials this admission: metronidazole 3/15 >> 3/15 ciprofloxacin 3/15 >> 3/16 Zosyn 3/16 >>   Microbiology results: 3/15 UCx: pending  3/15 SARS CoV-2: neg 3/15 C diff: pending 3/15 GI panel: pending  Thank you for allowing pharmacy to be a part of this patient's care.  Gabrial Domine A 12/21/2019 10:56 AM

## 2019-12-21 NOTE — Progress Notes (Addendum)
Aimee [facility] at [place] NAME: Aimee Stuart    MR#:  [id number]  DATE OF BIRTH:  1956/03/28  SUBJECTIVE:  Waiting for EGD and flex sig today. Some better and rested REVIEW OF SYSTEMS:   Review of Systems  Constitutional: Positive for malaise/fatigue. Negative for chills, fever and weight loss.  HENT: Negative for ear discharge, ear pain and nosebleeds.   Eyes: Negative for blurred vision, pain and discharge.  Respiratory: Negative for sputum production, wheezing and stridor.   Cardiovascular: Negative for chest pain, palpitations, orthopnea and PND.  Gastrointestinal: Positive for nausea. Negative for diarrhea.  Genitourinary: Negative for frequency and urgency.  Musculoskeletal: Negative for back pain and joint pain.  Neurological: Positive for weakness. Negative for sensory change, speech change and focal weakness.  Psychiatric/Behavioral: Negative for depression and hallucinations. The patient is not nervous/anxious.    Tolerating Diet:npo except ice chips Tolerating PT: ambulates without support DRUG ALLERGIES:  No Known Allergies  VITALS:  Blood pressure (!) 88/61, pulse 71, temperature (!) 97.2 F (36.2 C), temperature source Temporal, resp. rate 18, height 5' 8"  (1.727 m), weight 71.7 kg, SpO2 97 %.  PHYSICAL EXAMINATION:   Physical Exam  GENERAL:  64 y.o.-year-old patient lying in the bed with mild to mod acute distress.  EYES: Pupils equal, round, reactive to light and accommodation. No scleral icterus.   HEENT: Head atraumatic, normocephalic. Oropharynx and nasopharynx clear.  NECK:  Supple, no jugular venous distention. No thyroid enlargement, no tenderness.  LUNGS: Normal breath sounds bilaterally, no wheezing, rales, rhonchi. No use of accessory muscles of respiration.  CARDIOVASCULAR: S1, S2 normal. No murmurs, rubs, or gallops.  ABDOMEN: Soft,nontender, nondistended. Bowel sounds present. No organomegaly or mass.   EXTREMITIES: No cyanosis, clubbing or edema b/l.    NEUROLOGIC: Cranial nerves II through XII are intact. No focal Motor or sensory deficits b/l.   PSYCHIATRIC:  patient is anxious SKIN: No obvious rash, lesion, or ulcer.   LABORATORY PANEL:  CBC Recent Labs  Lab 12/21/19 0321  WBC 17.4*  HGB 13.0  HCT 38.4  PLT 319    Chemistries  Recent Labs  Lab 12/17/19 1309 12/19/19 0750 12/21/19 0321  NA 139   < > 137  K 3.6   < > 3.1*  CL 106   < > 100  CO2 22   < > 26  GLUCOSE 176*   < > 108*  BUN 14   < > 8  CREATININE 0.87   < > 0.72  CALCIUM 9.5   < > 8.5*  MG  --    < > 2.2  AST 37  --   --   ALT 22  --   --   ALKPHOS 92  --   --   BILITOT 1.3*  --   --    < > = values in this interval not displayed.   Cardiac Enzymes No results for input(s): TROPONINI in the last 168 hours. RADIOLOGY:  DG Abd 1 View  Result Date: 12/20/2019 CLINICAL DATA:  Acute onset of severe nausea and vomiting with LEFT lower quadrant pain on 12/16/2019 EXAM: ABDOMEN - 1 VIEW COMPARISON:  12/19/2019 FINDINGS: Scattered gas throughout nondistended colon to rectum. Mild stool RIGHT and transverse colon. Small bowel gas pattern normal. No bowel wall thickening or evidence of obstruction. Bones demineralized. No definite urinary tract calcifications. IMPRESSION: Nonobstructive bowel gas pattern. Electronically Signed   By: Aimee Stuart M.D.   On:  12/20/2019 08:27   ASSESSMENT AND PLAN:  Aimee Stuart  is a 64 y.o. female with a known history of anxiety, depression, history of insomnia comes to the emergency room with acute onset severe nausea vomiting and left lower quadrant abdominal pain  1. SIRS due to Acute colitis, left lower quadrant--POA -patient presented with nausea vomiting left lower quadrant abdominal pain -improving -elevated white count of 20.3-->32K->30K-> 17.9->17.4 -clinically seem to be improving  -CT abdomen suggestive of acute colitis, descending Colon - EGD& Flex sig showing  non-bleeding duodenal ulcers and severe colitis in the left colon concerning for ischemic colitis/duodenitis.  GI recommends 7 days of Cipro and Flagyl and Protonix 40 mg twice a day Patient follow-up with GI in 2 to 3 weeks -Advance diet as tolerated -Checking H. pylori  2. Leukocytosis due to number one -continue to monitor, improving  3. History of depression/anxiety -Wellbutrin and Klonopin - ativan IV prn  4. History of smoking -smoking cessation counseling done  5. H/o Insomnia - trazodone qhs  6. DVT prophylaxis subcu Lovenox  7. Elevated BP w/o h/o HTN--likely due to pain with N/V -prn IV hydralazine -She is somewhat hypotensive today  8.Hyperglycemia--No h/o DM-2 -  A1c 5.1   Family Communication : Updated patient's daughter Aimee Stuart @ [phone]  Consults :GI --Dr Aimee Stuart, Surgery Code Status :FULL code DVT prophylaxis :lovenox Discharge disposition: Home in 1-2 days Barriers -getting EGD and flex sig today.  Resume diet and if she tolerates, should be able to go home   [place] THIS PATIENT: *30* minutes.  >50% time spent on counselling and coordination of care  Note: This dictation was prepared with Dragon dictation along with smaller phrase technology. Any transcriptional errors that result from this process are unintentional.  Aimee Stuart M.D    [facility]   CC: Primary care physician; Aimee Stuart, MDPatient ID: Aimee Stuart, female   DOB: 12/19/1955, 64 y.o.   MRN: [id number]

## 2019-12-22 ENCOUNTER — Inpatient Hospital Stay (HOSPITAL_COMMUNITY)
Admit: 2019-12-22 | Discharge: 2019-12-22 | Disposition: A | Discharge status: Home / Self Care | Payer: Managed Care, Other (non HMO) | Attending: Internal Medicine | Admitting: Internal Medicine

## 2019-12-22 DIAGNOSIS — R0600 Dyspnea, unspecified: Secondary | ICD-10-CM

## 2019-12-22 LAB — BASIC METABOLIC PANEL
Anion gap: 9 (ref 5–15)
BUN: 8 mg/dL (ref 8–23)
CO2: 26 mmol/L (ref 22–32)
Calcium: 8.7 mg/dL — ABNORMAL LOW (ref 8.9–10.3)
Chloride: 100 mmol/L (ref 98–111)
Creatinine, Ser: 0.66 mg/dL (ref 0.44–1.00)
GFR calc Af Amer: >60 mL/min (ref >60)
GFR calc non Af Amer: >60 mL/min (ref >60)
Glucose, Bld: 98 mg/dL (ref 70–99)
Potassium: 3.4 mmol/L — ABNORMAL LOW (ref 3.5–5.1)
Sodium: 135 mmol/L (ref 135–145)

## 2019-12-22 LAB — CBC
HCT: 38 % (ref 36.0–46.0)
Hemoglobin: 12.6 g/dL (ref 12.0–15.0)
MCH: 28.8 pg (ref 26.0–34.0)
MCHC: 33.2 g/dL (ref 30.0–36.0)
MCV: 86.8 fL (ref 80.0–100.0)
Platelets: 370 10*3/uL (ref 150–400)
RBC: 4.38 MIL/uL (ref 3.87–5.11)
RDW: 12.7 % (ref 11.5–15.5)
WBC: 19.2 10*3/uL — ABNORMAL HIGH (ref 4.0–10.5)
nRBC: 0 % (ref 0.0–0.2)

## 2019-12-22 LAB — PHOSPHORUS: Phosphorus: 2.4 mg/dL — ABNORMAL LOW (ref 2.5–4.6)

## 2019-12-22 MED ORDER — METRONIDAZOLE 500 MG PO TABS
500.0000 mg | ORAL_TABLET | Freq: Three times a day (TID) | ORAL | Status: DC
  Administered 2019-12-22 – 2019-12-24 (×7): 500 mg via ORAL
  Filled 2019-12-22 (×7): qty 1

## 2019-12-22 MED ORDER — CIPROFLOXACIN 500 MG/5ML (10%) PO SUSR
500.0000 mg | Freq: Two times a day (BID) | ORAL | Status: DC
  Administered 2019-12-22 – 2019-12-23 (×4): 500 mg via ORAL
  Filled 2019-12-22 (×7): qty 5

## 2019-12-22 MED ORDER — POTASSIUM & SODIUM PHOSPHATES 280-160-250 MG PO PACK
2.0000 | PACK | Freq: Three times a day (TID) | ORAL | Status: AC
  Administered 2019-12-22 (×3): 2 via ORAL
  Filled 2019-12-22 (×3): qty 2

## 2019-12-22 NOTE — Progress Notes (Signed)
[facility] SURGICAL PROGRESS NOTE (cpt [phone])  Hospital Day(s): 5.   Post op day(s): 1 Day Post-Op.   Interval History: Patient seen and examined, no acute events or new complaints overnight. Patient reports she feels better.  She is tolerating clear liquids, but states that she does not have much of an appetite.  She underwent colonoscopy and EGD with gastroenterology.  Biopsies were taken from both the duodenum and the colon, concerning for possible ischemic colitis.  She has been initiated on Cipro and Flagyl as well as Protonix.  Her white blood cell count is slightly elevated from yesterday, however this may represent a reactive phase from her procedures yesterday.   Review of Systems:  Constitutional: denies fever, chills  HEENT: denies cough or congestion  Respiratory: denies any shortness of breath  Cardiovascular: denies chest pain or palpitations  Gastrointestinal: denies abdominal pain, N/V, or diarrhea/and bowel function as per interval history Genitourinary: denies burning with urination or urinary frequency   Vital signs in last 24 hours: [min-max] current  Temp:  [97.8 F (36.6 C)-98.5 F (36.9 C)] 98.5 F (36.9 C) (03/20 1638) Pulse Rate:  [73-113] 87 (03/20 1638) Resp:  [17-18] 17 (03/20 1638) BP: (158-185)/(84-99) 160/85 (03/20 1638) SpO2:  [97 %-99 %] 98 % (03/20 1638)     Height: 5' 8"  (172.7 cm) Weight: 71.7 kg BMI (Calculated): 24.03   Intake/Output last 2 shifts:  03/19 0701 - 03/20 0700 In: 1080.1 [I.V.:500; IV Piggyback:580.1] Out: -    Physical Exam:  Constitutional: alert, cooperative and no distress  HENT: normocephalic without obvious abnormality  Eyes: PERRL, EOM's grossly intact and symmetric  Respiratory: breathing non-labored at rest  Cardiovascular: regular rate and sinus rhythm  Gastrointestinal: soft,non-tender, no distended, no rebound/guarding, no peritonitis Musculoskeletal: no edema or wounds, motor and sensation  grossly intact, NT   Labs:  CBC Latest Ref Rng & Units 12/22/2019 12/21/2019 12/20/2019  WBC 4.0 - 10.5 K/uL 19.2(H) 17.4(H) 17.9(H)  Hemoglobin 12.0 - 15.0 g/dL 12.6 13.0 11.5(L)  Hematocrit 36.0 - 46.0 % 38.0 38.4 34.4(L)  Platelets 150 - 400 K/uL 370 319 229   CMP Latest Ref Rng & Units 12/22/2019 12/21/2019 12/19/2019  Glucose 70 - 99 mg/dL 98 108(H) 146(H)  BUN 8 - 23 mg/dL 8 8 22   Creatinine 0.44 - 1.00 mg/dL 0.66 0.72 0.94  Sodium 135 - 145 mmol/L 135 137 135  Potassium 3.5 - 5.1 mmol/L 3.4(L) 3.1(L) 3.7  Chloride 98 - 111 mmol/L 100 100 100  CO2 22 - 32 mmol/L 26 26 26   Calcium 8.9 - 10.3 mg/dL 8.7(L) 8.5(L) 8.0(L)  Total Protein 6.5 - 8.1 g/dL - - -  Total Bilirubin 0.3 - 1.2 mg/dL - - -  Alkaline Phos 38 - 126 U/L - - -  AST 15 - 41 U/L - - -  ALT 0 - 44 U/L - - -     Imaging studies: No new pertinent imaging studies   Assessment/Plan: (ICD-10's: K52.9) 64 y.o. female with improvement in leukocytosisand clinical improvement in abdominal pain with return of bowel functionattributable to acute colitis uncertain etiology without overt peritonitis on examinationresulting in colonic ileus   - Advance diet as tolerated  - Continue IV Abx per GI recommendations  - Trend leukocytosis  - pain control prn; antiemetics prn  - monitor abdominal examination; on-going bowel function   - mobilize as tolerates  - Appreciate GI involvement and recommendations - Further management per primary service  -At this time, she does  not appear to have any surgical indications and we will sign off.  All of the above findings and recommendations were discussed with the patient, and the medical team, and all of patient's questions were answered to her expressed satisfaction.   I saw and evaluated the patient.  I agree with the above documentation, exam, and plan, which I have edited where appropriate. Fredirick Maudlin  7:24 PM

## 2019-12-22 NOTE — Evaluation (Signed)
Physical Therapy Evaluation Patient Details Name: Aimee Stuart MRN: [id number] DOB: 10-08-1955 Today's Date: 12/22/2019   History of Present Illness  Aimee Stuart  is a 64 y.o. female with a known history of anxiety, depression, history of insomnia comes to the emergency room with acute onset severe nausea vomiting and left lower quadrant abdominal pain. In ER CT scan of the abdomen shows left lower quadrant acute colitis.In ER, patient started on IV Cipro and Flagyl. Given her symptoms of nausea vomiting unable to keep anything PO and elevated white count with abnormal CT scan results, admitted for further evaluation management.  Clinical Impression  Pt is a pleasant 64 year old F who was admitted for acute colitis. Pt performs bed mobility with mod I, transfers with CGA for safety, and ambulation with CGA without AD. Pt is generally slightly impulsive and lethargic, demonstrating poor safety awareness and requiring CGA for out of bed mobility for safety. Pt requiring verbal cuing throughout for improved safety awareness with transfers and ambulation. Pt performs STS with close supervision, electing to do so though asked to wait; closely guarded as pt demos mild near loss of balance utilizing stepping strategy upon initially standing. Pt also elects at times to utilize IV pole for support, requiring cuing on multiple occasions to avoid doing so. Pt demonstrates elevated HR this session, demonstrating 113 bpm following supine>sit transfer, which decreases to 101 with very brief seated rest break. Pt requests to use bathroom and is able to ambulate with CGA from bedside to bathroom ~15' without AD. Limited ambulation distance secondary to elevated HR; RN notified following PT session.   Pt demonstrates deficits with strength, balance, and safety awareness requiring continued skilled PT intervention.     Follow Up Recommendations Home health PT    Equipment Recommendations  (pt has SPC, FWW, and  shower chair)    Recommendations for Other Services       Precautions / Restrictions Precautions Precautions: Fall      Mobility  Bed Mobility Overal bed mobility: Modified Independent Bed Mobility: Supine to Sit;Sit to Supine     Supine to sit: Modified independent (Device/Increase time) Sit to supine: Modified independent (Device/Increase time)      Transfers Overall transfer level: Needs assistance Equipment used: None Transfers: Sit to/from Stand Sit to Stand: Min guard         General transfer comment: CGA assist for safety  Ambulation/Gait Ambulation/Gait assistance: Min assist Gait Distance (Feet): 15 Feet(x2 to ambulate from bed to bathroom) Assistive device: None Gait Pattern/deviations: WFL(Within Functional Limits)     General Gait Details: Pt slightly impulsive and with poor safety awareness when ambulating to bathroom, standing up quickly after asked to wait and demonstrating mild near loss of balance requiring stepping strategy. Pt also utilizes IV pole at times when walking, though asked not to.  Stairs            Wheelchair Mobility    Modified Rankin (Stroke Patients Only)       Balance Overall balance assessment: Needs assistance Sitting-balance support: Feet supported;No upper extremity supported Sitting balance-Leahy Scale: Good Sitting balance - Comments: able to sit steady EOB while reaching outside BOS and shifting weight to adjust gown   Standing balance support: No upper extremity supported;During functional activity Standing balance-Leahy Scale: Fair Standing balance comment: Able to stand without UE support while readjusting gown; however mild near loss of balance requiring use of stepping strategy  Pertinent Vitals/Pain Pain Assessment: No/denies pain    Home Living Family/patient expects to be discharged to:: Private residence Living Arrangements: Spouse/significant  other Available Help at Discharge: Family;Available 24 hours/day Type of Home: House Home Access: Stairs to enter   [employer] of Steps: 6 Home Layout: Multi-level;Able to live on main level with bedroom/bathroom Home Equipment: Kasandra Knudsen - single point;Walker - 2 wheels;Shower seat      Prior Function Level of Independence: Independent               Hand Dominance        Extremity/Trunk Assessment   Upper Extremity Assessment Upper Extremity Assessment: Overall WFL for tasks assessed    Lower Extremity Assessment Lower Extremity Assessment: Overall WFL for tasks assessed    Cervical / Trunk Assessment Cervical / Trunk Assessment: Normal  Communication   Communication: No difficulties  Cognition Arousal/Alertness: Awake/alert;Lethargic(SLIGHTLY lethargic, reporting sleepy) Behavior During Therapy: WFL for tasks assessed/performed Overall Cognitive Status: Within Functional Limits for tasks assessed                                        General Comments      Exercises     Assessment/Plan    PT Assessment Patient needs continued PT services  PT Problem List Decreased strength;Decreased mobility;Decreased safety awareness;Decreased activity tolerance;Decreased balance       PT Treatment Interventions DME instruction;Therapeutic exercise;Gait training;Balance training;Stair training;Neuromuscular re-education;Functional mobility training;Therapeutic activities;Patient/family education    PT Goals (Current goals can be found in the Care Plan section)  Acute Rehab PT Goals Patient Stated Goal: to go home PT Goal Formulation: With patient Time For Goal Achievement: 01/05/20 Potential to Achieve Goals: Good    Frequency Min 2X/week   Barriers to discharge        Co-evaluation               AM-PAC PT "6 Clicks" Mobility  Outcome Measure Help needed turning from your back to your side while in a flat bed without using  bedrails?: None Help needed moving from lying on your back to sitting on the side of a flat bed without using bedrails?: A Little Help needed moving to and from a bed to a chair (including a wheelchair)?: A Little Help needed standing up from a chair using your arms (e.g., wheelchair or bedside chair)?: A Little Help needed to walk in hospital room?: A Little Help needed climbing 3-5 steps with a railing? : A Little 6 Click Score: 19    End of Session Equipment Utilized During Treatment: Gait belt Activity Tolerance: Patient tolerated treatment well;Patient limited by lethargy;Other (comment)(HR elevated throughout session, limiting mobility tasks) Patient left: in bed;with call bell/phone within reach;with bed alarm set Nurse Communication: Mobility status;Other (comment)(elevated HR, slight lethary) PT Visit Diagnosis: Unsteadiness on feet (R26.81);Muscle weakness (generalized) (M62.81);Difficulty in walking, not elsewhere classified (R26.2)    Time: 3235-5732 PT Time Calculation (min) (ACUTE ONLY): 21 min   Charges:   PT Evaluation $PT Eval Moderate Complexity: 1 Mod PT Treatments $Therapeutic Activity: 8-22 mins        Petra Kuba, PT, DPT 12/22/19, 3:12 PM

## 2019-12-22 NOTE — Progress Notes (Signed)
GI Inpatient Follow-up Note  Subjective:  Patient seen in follow-up for left-sided abdominal pain.  Patient had EGD and flexible sigmoidoscopy performed yesterday.  EGD revealed superficial, several duodenal bulb ulcers concerning for ischemia with gastric biopsies pending to rule out H. pylori.  She was started on Protonix 40 mg IV twice daily.  Flexible sigmoidoscopy revealed severe, ulcerated left-sided colitis beginning from sigmoid colon to the descending colon a continuous ulceration without bleeding.  These features were concerning for ischemic colitis.  Biopsies pending.  She was switched to Cipro and Flagyl.  Patient seen and examined this afternoon resting comfortably in hospital bed.  No acute events overnight. Patient is tolerating a clear liquid diet, but does not feel very hungry.  She reports her pain is improved.   Scheduled Inpatient Medications:  . buPROPion  150 mg Oral Daily  . ciprofloxacin  500 mg Oral BID  . clonazePAM  0.25 mg Oral QHS  . enoxaparin (LOVENOX) injection  40 mg Subcutaneous Q24H  . metroNIDAZOLE  500 mg Oral Q8H  . pantoprazole (PROTONIX) IV  40 mg Intravenous Q12H  . potassium & sodium phosphates  2 packet Oral TID WC & HS  . potassium chloride  40 mEq Oral Once  . traZODone  100 mg Oral QHS    Continuous Inpatient Infusions:   . lactated ringers 125 mL/hr at 12/22/19 1026    PRN Inpatient Medications:  acetaminophen **OR** acetaminophen, hydrALAZINE, ketorolac, LORazepam, metoCLOPramide (REGLAN) injection, ondansetron **OR** ondansetron (ZOFRAN) IV, promethazine, traMADol  Review of Systems: Constitutional: Weight is stable.  Eyes: No changes in vision. ENT: No oral lesions, sore throat.  GI: see HPI.  Heme/Lymph: No easy bruising.  CV: No chest pain.  GU: No hematuria.  Integumentary: No rashes.  Neuro: No headaches.  Psych: No depression/anxiety.  Endocrine: No heat/cold intolerance.  Allergic/Immunologic: No urticaria.  Resp: No  cough, SOB.  Musculoskeletal: No joint swelling.    Physical Examination: BP (!) 158/84   Pulse 79   Temp 97.8 F (36.6 C) (Oral)   Resp 17   Ht 5' 8"  (1.727 m)   Wt 71.7 kg   SpO2 99%   BMI 24.02 kg/m  Gen: NAD, alert and oriented x 4 HEENT: PEERLA, EOMI, Neck: supple, no JVD or thyromegaly Chest: CTA bilaterally, no wheezes, crackles, or other adventitious sounds CV: RRR, no m/g/c/r Abd: soft, NT, ND, +BS in all four quadrants; no HSM, guarding, ridigity, or rebound tenderness Ext: no edema, well perfused with 2+ pulses, Skin: no rash or lesions noted Lymph: no LAD  Data: Lab Results  Component Value Date   WBC 19.2 (H) 12/22/2019   HGB 12.6 12/22/2019   HCT 38.0 12/22/2019   MCV 86.8 12/22/2019   PLT 370 12/22/2019   Recent Labs  Lab 12/20/19 0307 12/21/19 0321 12/22/19 0512  HGB 11.5* 13.0 12.6   Lab Results  Component Value Date   NA 135 12/22/2019   K 3.4 (L) 12/22/2019   CL 100 12/22/2019   CO2 26 12/22/2019   BUN 8 12/22/2019   CREATININE 0.66 12/22/2019   Lab Results  Component Value Date   ALT 22 12/17/2019   AST 37 12/17/2019   GGT 15 11/24/2016   ALKPHOS 92 12/17/2019   BILITOT 1.3 (H) 12/17/2019   No results for input(s): APTT, INR, PTT in the last 168 hours. Assessment/Plan:  64 y/o Caucasian female with a PMH of tobacco abuse seen for left-sided abdominal pain  1.  Duodenal bulb ulcers -EGD  yesterday showed several superficial duodenal bulb ulcers concerning for ischemia.  Gastric biopsies pending. -Continue Protonix 40 mg IV twice daily -Add sucralfate 10 mL 4 times daily -Continue to advance diet as tolerated  2.  Severe, ulcerated left-sided colitis -Flexible sigmoidoscopy performed yesterday revealed continuous ulceration beginning in sigmoid colon to the descending colon without any bleeding.  Clinical concern for ischemic colitis. -Biopsy still pending to rule out ischemic colitis versus IBD -Continue Cipro and  Flagyl -Advance diet as tolerated -Continue supportive care -Counseled patient to avoid dehydration and constipation and control of blood pressure to promote good colonic perfusion -Patient will need close follow-up with Dr. [name] in 2-3 weeks -Discussed the importance of complete abstinence from smoking tobacco -Echo performed today. Results pending.  -Close follow-up is important for preventing recurrent episodes of ischemic colitis.   Following  Please call with questions or concerns.   Octavia Bruckner, PA-C [facility] [phone] (Cell)

## 2019-12-22 NOTE — Progress Notes (Signed)
[facility] [place] NAME: Aimee Stuart    MR#:  [id number]  DATE OF BIRTH:  03-25-1956  SUBJECTIVE:  Less pain today.  She would like to walk possible.  Tolerating liquid diet REVIEW OF SYSTEMS:   Review of Systems  Constitutional: Positive for malaise/fatigue. Negative for chills, fever and weight loss.  HENT: Negative for ear discharge, ear pain and nosebleeds.   Eyes: Negative for blurred vision, pain and discharge.  Respiratory: Negative for sputum production, wheezing and stridor.   Cardiovascular: Negative for chest pain, palpitations, orthopnea and PND.  Gastrointestinal: Positive for nausea. Negative for diarrhea.  Genitourinary: Negative for frequency and urgency.  Musculoskeletal: Negative for back pain and joint pain.  Neurological: Positive for weakness. Negative for sensory change, speech change and focal weakness.  Psychiatric/Behavioral: Negative for depression and hallucinations. The patient is not nervous/anxious.    Tolerating Diet: Clear liquid diet Tolerating PT: ambulates without support DRUG ALLERGIES:  No Known Allergies  VITALS:  Blood pressure (!) 158/84, pulse 79, temperature 97.8 F (36.6 C), temperature source Oral, resp. rate 17, height 5' 8"  (1.727 m), weight 71.7 kg, SpO2 99 %.  PHYSICAL EXAMINATION:   Physical Exam  GENERAL:  64 y.o.-year-old patient lying in the bed with mild to mod acute distress.  EYES: Pupils equal, round, reactive to light and accommodation. No scleral icterus.   HEENT: Head atraumatic, normocephalic. Oropharynx and nasopharynx clear.  NECK:  Supple, no jugular venous distention. No thyroid enlargement, no tenderness.  LUNGS: Normal breath sounds bilaterally, no wheezing, rales, rhonchi. No use of accessory muscles of respiration.  CARDIOVASCULAR: S1, S2 normal. No murmurs, rubs, or gallops.  ABDOMEN: Soft,nontender, nondistended. Bowel sounds present. No organomegaly or mass.   EXTREMITIES: No cyanosis, clubbing or edema b/l.    NEUROLOGIC: Cranial nerves II through XII are intact. No focal Motor or sensory deficits b/l.   PSYCHIATRIC:  patient is anxious SKIN: No obvious rash, lesion, or ulcer.   LABORATORY PANEL:  CBC Recent Labs  Lab 12/22/19 0512  WBC 19.2*  HGB 12.6  HCT 38.0  PLT 370    Chemistries  Recent Labs  Lab 12/17/19 1309 12/19/19 0750 12/21/19 0321 12/21/19 0321 12/22/19 0512  NA 139   < > 137   < > 135  K 3.6   < > 3.1*   < > 3.4*  CL 106   < > 100   < > 100  CO2 22   < > 26   < > 26  GLUCOSE 176*   < > 108*   < > 98  BUN 14   < > 8   < > 8  CREATININE 0.87   < > 0.72   < > 0.66  CALCIUM 9.5   < > 8.5*   < > 8.7*  MG  --    < > 2.2  --   --   AST 37  --   --   --   --   ALT 22  --   --   --   --   ALKPHOS 92  --   --   --   --   BILITOT 1.3*  --   --   --   --    < > = values in this interval not displayed.   Cardiac Enzymes No results for input(s): TROPONINI in the last 168 hours. RADIOLOGY:  No results found. ASSESSMENT AND PLAN:  Aimee Stuart  is a 64 y.o. female with a known history of anxiety, depression, history of insomnia comes to the emergency room with acute onset severe nausea vomiting and left lower quadrant abdominal pain  1. SIRS due to Acute colitis, left lower quadrant--POA -patient presented with nausea vomiting left lower quadrant abdominal pain -improving -elevated white count of 19.2 -clinically seem to be improving  -CT abdomen suggestive of acute colitis, descending Colon - EGD revealed superficial, several duodenal bulb ulcers concerning for ischemia, Gastric biopsies to evaluate for H. pylori -Continue Protonix 40 mg twice daily, will add sucralfate if needed Advance diet as tolerated  - Flexible sigmoidoscopy revealed severe, ulcerated left-sided colitis beginning from sigmoid colon to the descending colon, continuous ulceration, with no bleeding.  These features are highly consistent with  ischemic colitis Biopsies performed to evaluate for IBD or ischemic colitis Switched to Cipro and Flagyl -for total 7 days Close follow-up with GI in 2 to 3 weeks, Patient will need colonoscopy as outpatient Hold off on any steroids at this time unless pathology confirms IBD  2. Leukocytosis due to number one -continue to monitor, improving  3. History of depression/anxiety -Wellbutrin and Klonopin - ativan IV prn  4. History of smoking -smoking cessation counseling done  5. H/o Insomnia - trazodone qhs  6. DVT prophylaxis subcu Lovenox  7. Elevated BP w/o h/o HTN--likely due to pain with N/V -prn IV hydralazine  8.Hyperglycemia--No h/o DM-2 -  A1c 5.1   We will get physical therapy evaluation to ambulate her.  Stop morphine intravenously to see if her pain is staying controlled on oral pain medication   Family Communication : Updated patient's daughter [name] @ [phone] on 3/19 Consults :GI --Dr [name], Surgery Code Status :FULL code DVT prophylaxis :lovenox Discharge disposition: Home in 1-2 days Barriers -advancing diet and if she tolerates, needs pain under better control and ambulate   TOTAL TIME TAKING CARE OF THIS PATIENT: *30* minutes.  >50% time spent on counselling and coordination of care  Note: This dictation was prepared with Dragon dictation along with smaller phrase technology. Any transcriptional errors that result from this process are unintentional.  [name] M.D    [facility]   CC: Primary care physician; [name], MDPatient ID: Aimee Stuart, female   DOB: July 12, 1956, 64 y.o.   MRN: [id number]

## 2019-12-22 NOTE — Progress Notes (Signed)
*  PRELIMINARY RESULTS* Echocardiogram 2D Echocardiogram has been performed.  Aimee Stuart 12/22/2019, 12:26 PM

## 2019-12-23 LAB — BASIC METABOLIC PANEL
Anion gap: 11 (ref 5–15)
BUN: 8 mg/dL (ref 8–23)
CO2: 27 mmol/L (ref 22–32)
Calcium: 8.4 mg/dL — ABNORMAL LOW (ref 8.9–10.3)
Chloride: 98 mmol/L (ref 98–111)
Creatinine, Ser: 0.64 mg/dL (ref 0.44–1.00)
GFR calc Af Amer: >60 mL/min (ref >60)
GFR calc non Af Amer: >60 mL/min (ref >60)
Glucose, Bld: 99 mg/dL (ref 70–99)
Potassium: 3.3 mmol/L — ABNORMAL LOW (ref 3.5–5.1)
Sodium: 136 mmol/L (ref 135–145)

## 2019-12-23 LAB — CBC
HCT: 34.8 % — ABNORMAL LOW (ref 36.0–46.0)
Hemoglobin: 11.8 g/dL — ABNORMAL LOW (ref 12.0–15.0)
MCH: 28.7 pg (ref 26.0–34.0)
MCHC: 33.9 g/dL (ref 30.0–36.0)
MCV: 84.7 fL (ref 80.0–100.0)
Platelets: 361 10*3/uL (ref 150–400)
RBC: 4.11 MIL/uL (ref 3.87–5.11)
RDW: 13 % (ref 11.5–15.5)
WBC: 16.2 10*3/uL — ABNORMAL HIGH (ref 4.0–10.5)
nRBC: 0 % (ref 0.0–0.2)

## 2019-12-23 LAB — ECHOCARDIOGRAM COMPLETE
Height: 68 in
Weight: 2528 oz

## 2019-12-23 LAB — GASTRIN: Gastrin: <10 pg/mL (ref 0–115)

## 2019-12-23 MED ORDER — POTASSIUM CHLORIDE CRYS ER 20 MEQ PO TBCR
20.0000 meq | EXTENDED_RELEASE_TABLET | Freq: Once | ORAL | Status: AC
  Administered 2019-12-23: 20 meq via ORAL
  Filled 2019-12-23: qty 1

## 2019-12-23 MED ORDER — PANTOPRAZOLE SODIUM 40 MG PO TBEC
40.0000 mg | DELAYED_RELEASE_TABLET | Freq: Two times a day (BID) | ORAL | Status: DC
  Administered 2019-12-23 – 2019-12-24 (×2): 40 mg via ORAL
  Filled 2019-12-23 (×2): qty 1

## 2019-12-23 NOTE — Progress Notes (Signed)
GI Inpatient Follow-up Note  Subjective:  Patient seen in follow-up for left-sided abdominal pain 2/2 likely ischemic colitis with biopsies still pending. She denies any acute events overnight. She is still complaining of some nausea, but has not received her morning anti-emetic. She reports she tried to drink several sips of Coca-[employer]a and had to throw it up. She has not had a bowel movement this morning. WBC 16.2 from 19.2 yesterday. Potassium 3.3. General Surgery signed off patient's case last night.   Scheduled Inpatient Medications:  . buPROPion  150 mg Oral Daily  . ciprofloxacin  500 mg Oral BID  . clonazePAM  0.25 mg Oral QHS  . enoxaparin (LOVENOX) injection  40 mg Subcutaneous Q24H  . metroNIDAZOLE  500 mg Oral Q8H  . pantoprazole (PROTONIX) IV  40 mg Intravenous Q12H  . potassium chloride  40 mEq Oral Once  . traZODone  100 mg Oral QHS    Continuous Inpatient Infusions:   . lactated ringers 125 mL/hr at 12/23/19 0249    PRN Inpatient Medications:  acetaminophen **OR** acetaminophen, hydrALAZINE, LORazepam, metoCLOPramide (REGLAN) injection, ondansetron **OR** ondansetron (ZOFRAN) IV, promethazine, traMADol  Review of Systems: Constitutional: Weight is stable.  Eyes: No changes in vision. ENT: No oral lesions, sore throat.  GI: see HPI.  Heme/Lymph: No easy bruising.  CV: No chest pain.  GU: No hematuria.  Integumentary: No rashes.  Neuro: No headaches.  Psych: No depression/anxiety.  Endocrine: No heat/cold intolerance.  Allergic/Immunologic: No urticaria.  Resp: No cough, SOB.  Musculoskeletal: No joint swelling.    Physical Examination: BP (!) 174/83 (BP Location: Right Arm)   Pulse 87   Temp 98.3 F (36.8 C) (Oral)   Resp 16   Ht 5' 8"  (1.727 m)   Wt 71.7 kg   SpO2 97%   BMI 24.02 kg/m  Gen: NAD, alert and oriented x 4 HEENT: PEERLA, EOMI, Neck: supple, no JVD or thyromegaly Chest: CTA bilaterally, no wheezes, crackles, or other adventitious  sounds CV: RRR, no m/g/c/r Abd: soft, NT, ND, +BS in all four quadrants; no HSM, guarding, ridigity, or rebound tenderness Ext: no edema, well perfused with 2+ pulses, Skin: no rash or lesions noted Lymph: no LAD  Data: Lab Results  Component Value Date   WBC 16.2 (H) 12/23/2019   HGB 11.8 (L) 12/23/2019   HCT 34.8 (L) 12/23/2019   MCV 84.7 12/23/2019   PLT 361 12/23/2019   Recent Labs  Lab 12/21/19 0321 12/22/19 0512 12/23/19 0510  HGB 13.0 12.6 11.8*   Lab Results  Component Value Date   NA 136 12/23/2019   K 3.3 (L) 12/23/2019   CL 98 12/23/2019   CO2 27 12/23/2019   BUN 8 12/23/2019   CREATININE 0.64 12/23/2019   Lab Results  Component Value Date   ALT 22 12/17/2019   AST 37 12/17/2019   GGT 15 11/24/2016   ALKPHOS 92 12/17/2019   BILITOT 1.3 (H) 12/17/2019   No results for input(s): APTT, INR, PTT in the last 168 hours. Assessment/Plan:  64 y/o Caucasian female with a PMH of tobacco abuse seen for left-sided abdominal pain  1.  Duodenal bulb ulcers -EGD 03/19 showed several superficial duodenal bulb ulcers concerning for ischemia.  Gastric biopsies pending. -Continue Protonix 40 mg IV twice daily -Add sucralfate 10 mL 4 times daily -Continue to advance diet as tolerated  2.  Severe, ulcerated left-sided colitis -Flexible sigmoidoscopy performed 03/19 revealed continuous ulceration beginning in sigmoid colon to the descending colon without any  bleeding.  Clinical concern for ischemic colitis. -Biopsies still pending to rule out ischemic colitis versus IBD -Continue Cipro and Flagyl -Advance diet as tolerated -Continue supportive care -Counseled patient to avoid dehydration and constipation and control of blood pressure to promote good colonic perfusion -Patient will need close follow-up with Dr. [name] in 2-3 weeks -Discussed the importance of complete abstinence from smoking tobacco -Echo performed yesterday. Results pending.  -Close follow-up is  important for preventing recurrent episodes of ischemic colitis.  -[facility] will resume care starting tomorrow     Please call with questions or concerns.    Octavia Bruckner, PA-C [facility] [phone] (Cell)

## 2019-12-23 NOTE — Progress Notes (Signed)
[facility] [place] NAME: Aimee Stuart    MR#:  [id number]  DATE OF BIRTH:  [birth year]  SUBJECTIVE:  Improving, Tolerating soft diet REVIEW OF SYSTEMS:   Review of Systems  Constitutional: Positive for malaise/fatigue. Negative for chills, fever and weight loss.  HENT: Negative for ear discharge, ear pain and nosebleeds.   Eyes: Negative for blurred vision, pain and discharge.  Respiratory: Negative for sputum production, wheezing and stridor.   Cardiovascular: Negative for chest pain, palpitations, orthopnea and PND.  Gastrointestinal: Positive for nausea. Negative for diarrhea.  Genitourinary: Negative for frequency and urgency.  Musculoskeletal: Negative for back pain and joint pain.  Neurological: Positive for weakness. Negative for sensory change, speech change and focal weakness.  Psychiatric/Behavioral: Negative for depression and hallucinations. The patient is not nervous/anxious.    Tolerating Diet: soft diet Tolerating PT: ambulates without support DRUG ALLERGIES:  No Known Allergies  VITALS:  Blood pressure (!) 174/83, pulse 87, temperature 98.3 F (36.8 C), temperature source Oral, resp. rate 16, height 5' 8"  (1.727 m), weight 71.7 kg, SpO2 97 %. PHYSICAL EXAMINATION:   Physical Exam  GENERAL:  64 y.o.-year-old patient lying in the bed with mild to mod acute distress.  EYES: Pupils equal, round, reactive to light and accommodation. No scleral icterus.   HEENT: Head atraumatic, normocephalic. Oropharynx and nasopharynx clear.  NECK:  Supple, no jugular venous distention. No thyroid enlargement, no tenderness.  LUNGS: Normal breath sounds bilaterally, no wheezing, rales, rhonchi. No use of accessory muscles of respiration.  CARDIOVASCULAR: S1, S2 normal. No murmurs, rubs, or gallops.  ABDOMEN: Soft,nontender, nondistended. Bowel sounds present. No organomegaly or mass.  EXTREMITIES: No cyanosis, clubbing or edema b/l.     NEUROLOGIC: Cranial nerves II through XII are intact. No focal Motor or sensory deficits b/l.   PSYCHIATRIC: mood and affect normal SKIN: No obvious rash, lesion, or ulcer.  LABORATORY PANEL:  CBC Recent Labs  Lab 12/23/19 0510  WBC 16.2*  HGB 11.8*  HCT 34.8*  PLT 361    Chemistries  Recent Labs  Lab 12/17/19 1309 12/19/19 0750 12/21/19 0321 12/22/19 0512 12/23/19 0510  NA 139   < > 137   < > 136  K 3.6   < > 3.1*   < > 3.3*  CL 106   < > 100   < > 98  CO2 22   < > 26   < > 27  GLUCOSE 176*   < > 108*   < > 99  BUN 14   < > 8   < > 8  CREATININE 0.87   < > 0.72   < > 0.64  CALCIUM 9.5   < > 8.5*   < > 8.4*  MG  --    < > 2.2  --   --   AST 37  --   --   --   --   ALT 22  --   --   --   --   ALKPHOS 92  --   --   --   --   BILITOT 1.3*  --   --   --   --    < > = values in this interval not displayed.   Cardiac Enzymes No results for input(s): TROPONINI in the last 168 hours. RADIOLOGY:  ECHOCARDIOGRAM COMPLETE  Result Date: 12/23/2019    ECHOCARDIOGRAM REPORT   Patient Name:   Aimee Stuart Date  of Exam: 12/22/2019 Medical Rec #:  [id number]       Height:       68.0 in Accession #:    [id number]      Weight:       158.0 lb Date of Birth:  11-Aug-1956      BSA:          1.849 m Patient Age:    64 years        BP:           183/94 mmHg Patient Gender: F               HR:           88 bpm. Exam Location:  [facility] Procedure: 2D Echo Indications:     Dyspnea 786.09/ R06.00  History:         Patient has no prior history of Echocardiogram examinations.  Sonographer:     Arville Go RDCS Referring Phys:  [place] Diagnosing Phys: Ida Rogue MD IMPRESSIONS  1. Left ventricular ejection fraction, by estimation, is 60 to 65%. The left ventricle has normal function. The left ventricle has no regional wall motion abnormalities. Left ventricular diastolic parameters are consistent with Grade I diastolic dysfunction (impaired relaxation).  2. Right ventricular systolic  function is normal. The right ventricular size is normal. Tricuspid regurgitation signal is inadequate for assessing PA pressure.  3. The inferior vena cava is normal in size with greater than 50% respiratory variability, suggesting right atrial pressure of 3 mmHg. FINDINGS  Left Ventricle: Left ventricular ejection fraction, by estimation, is 60 to 65%. The left ventricle has normal function. The left ventricle has no regional wall motion abnormalities. The left ventricular internal cavity size was normal in size. There is  no left ventricular hypertrophy. Left ventricular diastolic parameters are consistent with Grade I diastolic dysfunction (impaired relaxation). Right Ventricle: The right ventricular size is normal. No increase in right ventricular wall thickness. Right ventricular systolic function is normal. Tricuspid regurgitation signal is inadequate for assessing PA pressure. Left Atrium: Left atrial size was normal in size. Right Atrium: Right atrial size was normal in size. Pericardium: There is no evidence of pericardial effusion. Mitral Valve: The mitral valve is normal in structure. Normal mobility of the mitral valve leaflets. Mild mitral valve regurgitation. No evidence of mitral valve stenosis. Tricuspid Valve: The tricuspid valve is normal in structure. Tricuspid valve regurgitation is mild . No evidence of tricuspid stenosis. Aortic Valve: The aortic valve was not well visualized. Aortic valve regurgitation is not visualized. No aortic stenosis is present. Aortic valve mean gradient measures 6.0 mmHg. Aortic valve peak gradient measures 11.3 mmHg. Aortic valve area, by VTI measures 2.14 cm. Pulmonic Valve: The pulmonic valve was normal in structure. Pulmonic valve regurgitation is not visualized. No evidence of pulmonic stenosis. Aorta: The aortic root is normal in size and structure. Venous: The inferior vena cava is normal in size with greater than 50% respiratory variability, suggesting right  atrial pressure of 3 mmHg. IAS/Shunts: No atrial level shunt detected by color flow Doppler.  LEFT VENTRICLE PLAX 2D LVIDd:         4.45 cm  Diastology LVIDs:         3.10 cm  LV e' medial:   8.92 cm/s LV PW:         1.00 cm  LV E/e' medial: 8.4 LV IVS:        0.99 cm LVOT diam:  1.70 cm LV SV:         68 LV SV Index:   37 LVOT Area:     2.27 cm  RIGHT VENTRICLE RV Basal diam:  2.53 cm RV S prime:     20.10 cm/s TAPSE (M-mode): 2.6 cm LEFT ATRIUM             Index       RIGHT ATRIUM          Index LA diam:        2.80 cm 1.51 cm/m  RA Area:     9.94 cm LA Vol (A2C):   18.6 ml 10.06 ml/m RA Volume:   20.30 ml 10.98 ml/m LA Vol (A4C):   11.3 ml 6.11 ml/m LA Biplane Vol: 14.9 ml 8.06 ml/m  AORTIC VALVE                    PULMONIC VALVE AV Area (Vmax):    2.05 cm     PV Vmax:       1.49 m/s AV Area (Vmean):   2.27 cm     PV Peak grad:  8.9 mmHg AV Area (VTI):     2.14 cm AV Vmax:           168.00 cm/s AV Vmean:          108.000 cm/s AV VTI:            0.318 m AV Peak Grad:      11.3 mmHg AV Mean Grad:      6.0 mmHg LVOT Vmax:         152.00 cm/s LVOT Vmean:        108.000 cm/s LVOT VTI:          0.300 m LVOT/AV VTI ratio: 0.94  AORTA Ao Root diam: 2.80 cm MITRAL VALVE MV Area (PHT): 3.48 cm    SHUNTS MV Decel Time: 218 msec    Systemic VTI:  0.30 m MV E velocity: 74.70 cm/s  Systemic Diam: 1.70 cm MV A velocity: 86.80 cm/s MV E/A ratio:  0.86 Ida Rogue MD Electronically signed by Ida Rogue MD Signature Date/Time: 12/23/2019/8:39:54 AM    Final    ASSESSMENT AND PLAN:  Aimee Stuart  is a 64 y.o. female with a known history of anxiety, depression, history of insomnia comes to the emergency room with acute onset severe nausea vomiting and left lower quadrant abdominal pain  1. SIRS due to Acute colitis, left lower quadrant--POA -patient presented with nausea vomiting left lower quadrant abdominal pain -improving -elevated white count of 16.2 -clinically seem to be improving  -CT abdomen  suggestive of acute colitis, descending Colon - EGD revealed superficial, several duodenal bulb ulcers concerning for ischemia, Gastric biopsies to evaluate for H. pylori -Continue Protonix 40 mg twice daily, will add sucralfate if needed - Flexible sigmoidoscopy revealed severe, ulcerated left-sided colitis beginning from sigmoid colon to the descending colon, continuous ulceration, with no bleeding.  These features are highly consistent with ischemic colitis Biopsies performed to evaluate for IBD or ischemic colitis Switched to Cipro and Flagyl -for total 7 days Close follow-up with GI in 2 to 3 weeks, Patient will need colonoscopy as outpatient Hold off on any steroids at this time unless pathology confirms IBD  2. Leukocytosis due to number one -continue to monitor, improving  3. History of depression/anxiety -Wellbutrin and Klonopin - stopping ativan  4. History of smoking -smoking cessation counseling done  5. H/o  Insomnia - trazodone qhs  6. DVT prophylaxis subcu Lovenox  7. Elevated BP w/o h/o HTN--likely due to pain with N/V -prn IV hydralazine  8.Hyperglycemia--No h/o DM-2 -  A1c 5.1   Ambulated well with physical therapy   Family Communication : Updated patient's daughter [name] @ [phone] on 3/19 Consults :GI --Dr [name], Surgery Code Status :FULL code DVT prophylaxis :lovenox Discharge disposition: Home in 1 day Barriers - none   TOTAL TIME TAKING CARE OF THIS PATIENT: *30* minutes.  >50% time spent on counselling and coordination of care  Note: This dictation was prepared with Dragon dictation along with smaller phrase technology. Any transcriptional errors that result from this process are unintentional.  [name] M.D    [facility]   CC: Primary care physician; [name], MDPatient ID: Aimee Stuart, female   DOB: 1956/01/25, 64 y.o.   MRN: [id number]

## 2019-12-24 ENCOUNTER — Encounter: Payer: Self-pay | Admitting: *Deleted

## 2019-12-24 LAB — CREATININE, SERUM
Creatinine, Ser: 0.79 mg/dL (ref 0.44–1.00)
GFR calc Af Amer: >60 mL/min (ref >60)
GFR calc non Af Amer: >60 mL/min (ref >60)

## 2019-12-24 MED ORDER — METRONIDAZOLE 500 MG PO TABS: 500.0000 mg | ORAL_TABLET | Freq: Three times a day (TID) | ORAL | 0 refills | Status: AC

## 2019-12-24 MED ORDER — CIPROFLOXACIN HCL 500 MG PO TABS: 500.0000 mg | ORAL_TABLET | Freq: Two times a day (BID) | ORAL | 0 refills | Status: AC

## 2019-12-24 MED ORDER — PANTOPRAZOLE SODIUM 40 MG PO TBEC: 40.0000 mg | DELAYED_RELEASE_TABLET | Freq: Two times a day (BID) | ORAL | 0 refills | Status: AC

## 2019-12-24 NOTE — Discharge Instructions (Signed)
Colitis  Colitis is inflammation of the colon. Colitis may last a short time (be acute), or it may last a long time (become chronic). What are the causes? This condition may be caused by:  Viruses.  Bacteria.  Reaction to medicine.  Certain autoimmune diseases such as Crohn's disease or ulcerative colitis.  Radiation treatment.  Decreased blood flow to the bowel (ischemia). What are the signs or symptoms? Symptoms of this condition include:  Watery diarrhea.  Passing bloody or tarry stool.  Pain.  Fever.  Vomiting.  Tiredness (fatigue).  Weight loss.  Bloating.  Abdominal pain.  Having fewer bowel movements than usual.  A strong and sudden urge to have a bowel movement.  Feeling like the bowel is not empty after a bowel movement. How is this diagnosed? This condition is diagnosed with a stool test or a blood test. You may also have other tests, such as:  X-rays.  CT scan.  Colonoscopy.  Endoscopy.  Biopsy. How is this treated? Treatment for this condition depends on the cause. The condition may be treated by:  Resting the bowel. This involves not eating or drinking for a period of time.  Fluids that are given through an IV.  Medicine for pain and diarrhea.  Antibiotic medicines.  Cortisone medicines.  Surgery. Follow these instructions at home: Eating and drinking   Follow instructions from your health care provider about eating or drinking restrictions.  Drink enough fluid to keep your urine pale yellow.  Work with a dietitian to determine which foods cause your condition to flare up.  Avoid foods that cause flare-ups.  Eat a well-balanced diet. General instructions  If you were prescribed an antibiotic medicine, take it as told by your health care provider. Do not stop taking the antibiotic even if you start to feel better.  Take over-the-counter and prescription medicines only as told by your health care provider.  Keep all  follow-up visits as told by your health care provider. This is important. Contact a health care provider if:  Your symptoms do not go away.  You develop new symptoms. Get help right away if you:  Have a fever that does not go away with treatment.  Develop chills.  Have extreme weakness, fainting, or dehydration.  Have repeated vomiting.  Develop severe pain in your abdomen.  Pass bloody or tarry stool. Summary  Colitis is inflammation of the colon. Colitis may last a short time (be acute), or it may last a long time (become chronic).  Treatment for this condition depends on the cause and may include resting the bowel, taking medicines, or having surgery.  If you were prescribed an antibiotic medicine, take it as told by your health care provider. Do not stop taking the antibiotic even if you start to feel better.  Get help right away if you develop severe pain in your abdomen.  Keep all follow-up visits as told by your health care provider. This is important. This information is not intended to replace advice given to you by your health care provider. Make sure you discuss any questions you have with your health care provider. Document Revised: 03/23/2018 Document Reviewed: 03/23/2018 Elsevier Patient Education  2020 [employer].

## 2019-12-24 NOTE — TOC Progression Note (Signed)
Pt stable for dc today per MD. PT/OT recommended HH for dc and MD ordered. Contacted [employer] (manages pt's [employer]) to refer for [facility] authorization/arrangements. Multiple HH agencies were contacted by Carleene Overlie and there are no providers available either due to staffing or due to full schedules.   Contacted pt's cell phone number and her daughter, [name], answered. [name] was with pt at home. Discussed above and offered outpatient therapy arrangements. Pt stated that the doesn't really feel that she needs the PT/OT services and she declines offer to set up the outpatient therapy.

## 2019-12-24 NOTE — Progress Notes (Signed)
Discharge summary reviewed with verbal understanding. Answered all questions.

## 2019-12-25 LAB — SURGICAL PATHOLOGY

## 2019-12-26 NOTE — Discharge Summary (Signed)
[facility] NAME: Aimee Stuart    MR#:  [id number]  DATE OF BIRTH:  06-Nov-1955  DATE OF ADMISSION:  12/17/2019   ADMITTING PHYSICIAN: Fritzi Mandes, MD  DATE OF DISCHARGE: 12/24/2019 11:01 AM  PRIMARY CARE PHYSICIAN: [name], MD   ADMISSION DIAGNOSIS:  Colitis [K52.9] Acute colitis [K52.9] DISCHARGE DIAGNOSIS:  Active Problems:   Acute colitis  SECONDARY DIAGNOSIS:   Past Medical History:  Diagnosis Date  . Acute renal failure (HCC)    due to bladder damage during hysterectomy  . Insomnia   . Wears contact lenses    sometimes   HOSPITAL COURSE:  [name] a64 y.o.femalewith a known history of anxiety, depression, history of insomnia admitted with acute onset severe nausea vomiting and left lower quadrant abdominal pain  1.SIRS due toAcute colitis,left lower quadrant--POA -patient presented with nausea vomiting left lower quadrant abdominal pain -improving -clinically improving  -CT abdomen suggestive of acute colitis,descending Colon - EGD revealed superficial, several duodenal bulb ulcers concerning for ischemia, Gastric biopsies neg for H. pylori -Continue Protonix 40 mg twice daily - Flexible sigmoidoscopy revealed severe, ulcerated left-sided colitis beginning from sigmoid colon to the descending colon, continuous ulceration, with no bleeding. - Cipro and Flagyl -for total 7 days Close follow-up with GI in 2 to 3 weeks, Patient will need colonoscopy as outpatient  2.Leukocytosisimproved  3.History of depression/anxiety -stable on Wellbutrin and Klonopin  4.History of smoking -smoking cessation counseling done  5.H/o Insomnia - trazodone qhs  6.Elevated BP w/o h/o HTN--likely due to pain with N/V -Now improved  7.Hyperglycemia--No h/o DM-2 -  A1c 5.1  DISCHARGE CONDITIONS:  stable CONSULTS OBTAINED:  Treatment Team:  [name], MD DRUG ALLERGIES:  No Known  Allergies DISCHARGE MEDICATIONS:   Allergies as of 12/24/2019   No Known Allergies     Medication List    TAKE these medications   buPROPion 150 MG 24 hr tablet Commonly known as: WELLBUTRIN XL Take 150 mg by mouth daily.   ciprofloxacin 500 MG tablet Commonly known as: Cipro Take 1 tablet (500 mg total) by mouth 2 (two) times daily for 5 days.   clonazePAM 0.5 MG tablet Commonly known as: KLONOPIN Take 0.25 mg by mouth at bedtime as needed for anxiety.   fluticasone 50 MCG/ACT nasal spray Commonly known as: FLONASE Place 1-2 sprays into both nostrils daily as needed for allergies or rhinitis. Notes to patient: Not given in hospital   metroNIDAZOLE 500 MG tablet Commonly known as: FLAGYL Take 1 tablet (500 mg total) by mouth 3 (three) times daily for 5 days.   multivitamin with minerals Tabs tablet Take 1 tablet by mouth daily.   pantoprazole 40 MG tablet Commonly known as: PROTONIX Take 1 tablet (40 mg total) by mouth 2 (two) times daily before a meal.   traZODone 100 MG tablet Commonly known as: DESYREL TAKE 1 TABLET BY MOUTH EVERYDAY AT BEDTIME What changed:   how much to take  how to take this  when to take this  additional instructions      DISCHARGE INSTRUCTIONS:   DIET:  Regular diet DISCHARGE CONDITION:  Stable ACTIVITY:  Activity as tolerated OXYGEN:  Home Oxygen: No.  Oxygen Delivery: room air DISCHARGE LOCATION:  home   If you experience worsening of your admission symptoms, develop shortness of breath, life threatening emergency, suicidal or homicidal thoughts you must seek medical attention immediately by calling 911 or calling your MD immediately  if symptoms less  severe.  You Must read complete instructions/literature along with all the possible adverse reactions/side effects for all the Medicines you take and that have been prescribed to you. Take any new Medicines after you have completely understood and accpet all the possible  adverse reactions/side effects.   Please note  You were cared for by a hospitalist during your hospital stay. If you have any questions about your discharge medications or the care you received while you were in the hospital after you are discharged, you can call the unit and asked to speak with the hospitalist on call if the hospitalist that took care of you is not available. Once you are discharged, your primary care physician will handle any further medical issues. Please note that NO REFILLS for any discharge medications will be authorized once you are discharged, as it is imperative that you return to your primary care physician (or establish a relationship with a primary care physician if you do not have one) for your aftercare needs so that they can reassess your need for medications and monitor your lab values.    On the day of Discharge:  VITAL SIGNS:  Blood pressure 135/77, pulse 77, temperature 97.9 F (36.6 C), temperature source Oral, resp. rate 17, height 5' 8"  (1.727 m), weight 71.7 kg, SpO2 98 %. PHYSICAL EXAMINATION:  GENERAL:  64 y.o.-year-old patient lying in the bed with no acute distress.  EYES: Pupils equal, round, reactive to light and accommodation. No scleral icterus. Extraocular muscles intact.  HEENT: Head atraumatic, normocephalic. Oropharynx and nasopharynx clear.  NECK:  Supple, no jugular venous distention. No thyroid enlargement, no tenderness.  LUNGS: Normal breath sounds bilaterally, no wheezing, rales,rhonchi or crepitation. No use of accessory muscles of respiration.  CARDIOVASCULAR: S1, S2 normal. No murmurs, rubs, or gallops.  ABDOMEN: Soft, non-tender, non-distended. Bowel sounds present. No organomegaly or mass.  EXTREMITIES: No pedal edema, cyanosis, or clubbing.  NEUROLOGIC: Cranial nerves II through XII are intact. Muscle strength 5/5 in all extremities. Sensation intact. Gait not checked.  PSYCHIATRIC: The patient is alert and oriented x 3.  SKIN: No  obvious rash, lesion, or ulcer.  DATA REVIEW:   CBC Recent Labs  Lab 12/23/19 0510  WBC 16.2*  HGB 11.8*  HCT 34.8*  PLT 361    Chemistries  Recent Labs  Lab 12/21/19 0321 12/22/19 0512 12/23/19 0510 12/23/19 0510 12/24/19 0510  NA 137   < > 136  --   --   K 3.1*   < > 3.3*  --   --   CL 100   < > 98  --   --   CO2 26   < > 27  --   --   GLUCOSE 108*   < > 99  --   --   BUN 8   < > 8  --   --   CREATININE 0.72   < > 0.64   < > 0.79  CALCIUM 8.5*   < > 8.4*  --   --   MG 2.2  --   --   --   --    < > = values in this interval not displayed.     Outpatient follow-up Follow-up Information    [name], MD. Schedule an appointment as soon as possible for a visit on 12/25/2019.   Specialty: Family Medicine Why: at [address] Contact information: [address] [facility] [place] [address] [phone]  [name], MD. Schedule an appointment as soon as possible for a visit on 01/31/2020.   Specialty: Gastroenterology Why: at [place] information: [place] [address] [phone]            Management plans discussed with the patient, family and they are in agreement.  CODE STATUS: Prior   TOTAL TIME TAKING CARE OF THIS PATIENT: 45 minutes.    [name] M.D on 12/26/2019 at 7:24 PM  [facility]   CC: Primary care physician; [name], MD   Note: This dictation was prepared with Dragon dictation along with smaller phrase technology. Any transcriptional errors that result from this process are unintentional.

## 2020-01-15 ENCOUNTER — Other Ambulatory Visit: Payer: Self-pay

## 2020-01-15 ENCOUNTER — Encounter: Payer: Self-pay | Admitting: Gastroenterology

## 2020-01-15 ENCOUNTER — Ambulatory Visit: Payer: Managed Care, Other (non HMO) | Admitting: Gastroenterology

## 2020-01-15 VITALS — BP 133/91 | HR 80 | Temp 98.1°F | Ht 68.0 in | Wt 151.1 lb

## 2020-01-15 DIAGNOSIS — K5150 Left sided colitis without complications: Secondary | ICD-10-CM

## 2020-01-15 DIAGNOSIS — K559 Vascular disorder of intestine, unspecified: Secondary | ICD-10-CM

## 2020-01-15 DIAGNOSIS — K269 Duodenal ulcer, unspecified as acute or chronic, without hemorrhage or perforation: Secondary | ICD-10-CM

## 2020-01-15 DIAGNOSIS — K2981 Duodenitis with bleeding: Secondary | ICD-10-CM | POA: Diagnosis not present

## 2020-01-15 NOTE — Patient Instructions (Signed)

## 2020-01-15 NOTE — Progress Notes (Signed)
[name], MD [address]  [place]  [place], [place] [address]  Main: [phone]  Fax: [phone]    Gastroenterology Consultation  Referring Provider:     [name], MD Primary Care Physician:  [name], MD Primary Gastroenterologist:  Dr. [name] Reason for Consultation:     Hospital follow-up, left-sided colitis and duodenitis        HPI:   Aimee Stuart is a 64 y.o. female referred by Dr. [name], MD  for consultation & management of recent hospitalization for abdominal pain, nausea, vomiting and obstipation.  CT abdomen and pelvis revealed left-sided colitis.  Patient was empirically treated with antibiotics.  She has history of chronic tobacco use and chronic constipation.  There was no evidence of acute diverticulitis on the CT scan.  Patient is managed conservatively, her ileus resolved.  Subsequently, underwent flexible sigmoidoscopy which revealed severe left-sided colitis.  Biopsies confirmed acute colitis only.  No evidence of chronicity.  She also underwent upper endoscopy which revealed superficial ulcers in the duodenum concerning for ischemic duodenitis.  There was no evidence of H. pylori on gastric biopsies.  Patient is discharged home on 2 weeks course of Cipro and Flagyl and Protonix 40 mg twice daily.  Interval summary Patient reports that she has clinically improved since discharge, she had episodes of diarrhea for few days.  Her appetite has improved.  She reports that her constipation has returned.  She has not resumed smoking for 4 weeks since last hospitalization.  She reports drinking plenty of water.  She reports mild abdominal discomfort only.  She denies nausea or vomiting and has been tolerating p.o. well.  NSAIDs: None  Antiplts/Anticoagulants/Anti thrombotics: None  GI Procedures: EGD 12/21/2019 - Non-bleeding duodenal ulcers with a clean ulcer base (Forrest Class III). - Normal second portion of the  duodenum. - Small hiatal hernia. - Normal stomach. Biopsied. - LA Grade A reflux esophagitis with no bleeding.  Flexible sigmoidoscopy 12/21/2019 The perianal and digital rectal examinations were normal. Pertinent negatives include normal sphincter tone and no palpable rectal lesions. Tight rectosigmoid area A continuous area of nonbleeding ulcerated mucosa with no stigmata of recent bleeding was present in the sigmoid colon and in the descending colon. Biopsies were taken with a cold forceps for histology to evaluate for ischemic colitis or IBD. Normal mucosa was found in the rectum, rectosigmoid and in the proximal descending colon. Biopsies from normal appearing mucosa in proximal descending colon were taken with a cold forceps for histology.  DIAGNOSIS:  A. STOMACH; RANDOM COLD BIOPSY:  - OXYNTIC AND TRANSITIONAL MUCOSA WITHOUT PATHOLOGIC CHANGES.  - NEGATIVE FOR H. PYLORI, INTESTINAL METAPLASIA, DYSPLASIA, AND  MALIGNANCY.   B. LEFT COLON NORMAL MUCOSA; COLD BIOPSY:  - ONE 2 MM FRAGMENT OF PARTIAL THICKNESS MUCOSA, APPEARS TO BE  ASSOCIATED WITH ACUTE INFLAMMATORY EXUDATE.  - ONE 4 MM FRAGMENT OF UNREMARKABLE COLONIC MUCOSA.   C. LEFT COLON ULCERATED MUCOSA; COLD BIOPSY:  - ONE 2 MM FRAGMENT OF FLATTENED MUCOSA WITH A CRYPT ABSCESS.  - DETACHED FRAGMENT OF ACUTE INFLAMMATORY EXUDATE.  - FRAGMENTS OF UNREMARKABLE COLONIC MUCOSA.  Past Medical History:  Diagnosis Date  . Acute renal failure (HCC)    due to bladder damage during hysterectomy  . Insomnia   . Wears contact lenses    sometimes    Past Surgical History:  Procedure Laterality Date  . ABDOMINAL HYSTERECTOMY  2013   [facility]  . BLADDER REPAIR  damaged during hysterectomy  . CERVICAL BIOPSY  W/ LOOP ELECTRODE EXCISION  07/2008  . CESAREAN SECTION    . COLONOSCOPY WITH PROPOFOL N/A 12/20/2016   Procedure: COLONOSCOPY WITH PROPOFOL;  Surgeon: [name], MD;  Location: [facility];  Service: Endoscopy;   Laterality: N/A;  . COLPOSCOPY  2009  . ESOPHAGOGASTRODUODENOSCOPY N/A 12/21/2019   Procedure: ESOPHAGOGASTRODUODENOSCOPY (EGD);  Surgeon: [name], MD;  [facility] ENDOSCOPY;  Service: Gastroenterology;  Laterality: N/A;  . FLEXIBLE SIGMOIDOSCOPY N/A 12/21/2019   Procedure: FLEXIBLE SIGMOIDOSCOPY;  Surgeon: [name], MD;  Location: [facility] ENDOSCOPY;  Service: Gastroenterology;  Laterality: N/A;  . KNEE SURGERY Right    age 19     Current Outpatient Medications:  .  buPROPion (WELLBUTRIN XL) 150 MG 24 hr tablet, Take 150 mg by mouth daily., Disp: , Rfl:  .  clonazePAM (KLONOPIN) 0.5 MG tablet, Take 0.25 mg by mouth at bedtime as needed for anxiety., Disp: , Rfl:  .  Multiple Vitamin (MULTIVITAMIN WITH MINERALS) TABS tablet, Take 1 tablet by mouth daily., Disp: , Rfl:  .  pantoprazole (PROTONIX) 40 MG tablet, Take 1 tablet (40 mg total) by mouth 2 (two) times daily before a meal., Disp: 60 tablet, Rfl: 0 .  traZODone (DESYREL) 100 MG tablet, TAKE 1 TABLET BY MOUTH EVERYDAY AT BEDTIME (Patient taking differently: Take 100 mg by mouth at bedtime. ), Disp: 90 tablet, Rfl: 2   Family History  Problem Relation Age of Onset  . Alzheimer's disease Mother   . Cancer Father 30       prostate  . Heart disease Father      Social History   Tobacco Use  . Smoking status: Current Every Day Smoker    Packs/day: 0.50    Types: Cigarettes  . Smokeless tobacco: Never Used  Substance Use Topics  . Alcohol use: Yes    Alcohol/week: 0.0 standard drinks  . Drug use: No    Allergies as of 01/15/2020  . (No Known Allergies)    Review of Systems:    All systems reviewed and negative except where noted in HPI.   Physical Exam:  BP (!) 133/91 (BP Location: Left Arm, Patient Position: Sitting, Cuff Size: Normal)   Pulse 80   Temp 98.1 F (36.7 C) (Oral)   Ht 5' 8"  (1.727 m)   Wt 151 lb 2 oz (68.5 kg)   BMI 22.98 kg/m  No LMP recorded. Patient has had a  hysterectomy.  General:   Alert,  Well-developed, well-nourished, pleasant and cooperative in NAD Head:  Normocephalic and atraumatic. Eyes:  Sclera clear, no icterus.   Conjunctiva pink. Ears:  Normal auditory acuity. Nose:  No deformity, discharge, or lesions. Mouth:  No deformity or lesions,oropharynx pink & moist. Neck:  Supple; no masses or thyromegaly. Lungs:  Respirations even and unlabored.  Clear throughout to auscultation.   No wheezes, crackles, or rhonchi. No acute distress. Heart:  Regular rate and rhythm; no murmurs, clicks, rubs, or gallops. Abdomen:  Normal bowel sounds. Soft, non-tender and non-distended without masses, hepatosplenomegaly or hernias noted.  No guarding or rebound tenderness.   Rectal: Not performed Msk:  Symmetrical without gross deformities. Good, equal movement & strength bilaterally. Pulses:  Normal pulses noted. Extremities:  No clubbing or edema.  No cyanosis. Neurologic:  Alert and oriented x3;  grossly normal neurologically. Skin:  Intact without significant lesions or rashes. No jaundice. Psych:  Alert and cooperative. Normal mood and affect.  Imaging Studies: Reviewed  Assessment and Plan:   Aimee Stuart is a 64 y.o. female with history of chronic tobacco use, chronic constipation, left-sided colitis in 12/2019, flexible sigmoidoscopy confirmed severe left-sided colitis, pathology revealed severe acute colitis only.  She also had superficial duodenal ulcers.  CT abdomen and pelvis with contrast revealed aortic atherosclerosis, no evidence of focal narrowing or stenosis of mesenteric vasculature.  Currently asymptomatic and weight has been stable  Left-sided colitis, most likely ischemic colitis secondary to dehydration, tobacco use Recommend colonoscopy in 3 to 6 months to confirm healing and repeat biopsies to rule out inflammatory bowel disease I will hold off on CT angio at this time as CT abdomen pelvis with contrast revealed abdominal  atherosclerosis only Recheck CBC today Continue to avoid smoking  Chronic constipation Recommend high-fiber diet, MiraLAX daily Adequate intake of water  Duodenal ulcers, likely secondary to ischemia No evidence of H. pylori Continue Protonix 40 mg 1-2 times daily Normal serum gastrin levels   Follow up in 3 months   [name], MD

## 2020-01-18 ENCOUNTER — Other Ambulatory Visit: Payer: Managed Care, Other (non HMO)

## 2020-01-18 ENCOUNTER — Other Ambulatory Visit: Payer: Self-pay

## 2020-01-18 DIAGNOSIS — K559 Vascular disorder of intestine, unspecified: Secondary | ICD-10-CM

## 2020-01-19 LAB — CBC WITH DIFFERENTIAL/PLATELET
Basophils Absolute: 0.1 10*3/uL (ref 0.0–0.2)
Basos: 2 %
EOS (ABSOLUTE): 0.3 10*3/uL (ref 0.0–0.4)
Eos: 6 %
Hematocrit: 36.9 % (ref 34.0–46.6)
Hemoglobin: 12.3 g/dL (ref 11.1–15.9)
Immature Grans (Abs): 0 10*3/uL (ref 0.0–0.1)
Immature Granulocytes: 0 %
Lymphocytes Absolute: 1.6 10*3/uL (ref 0.7–3.1)
Lymphs: 29 %
MCH: 29.3 pg (ref 26.6–33.0)
MCHC: 33.3 g/dL (ref 31.5–35.7)
MCV: 88 fL (ref 79–97)
Monocytes Absolute: 0.6 10*3/uL (ref 0.1–0.9)
Monocytes: 10 %
Neutrophils Absolute: 2.9 10*3/uL (ref 1.4–7.0)
Neutrophils: 53 %
Platelets: 302 10*3/uL (ref 150–450)
RBC: 4.2 x10E6/uL (ref 3.77–5.28)
RDW: 12.6 % (ref 11.7–15.4)
WBC: 5.4 10*3/uL (ref 3.4–10.8)

## 2020-01-21 ENCOUNTER — Telehealth: Payer: Self-pay

## 2020-01-21 NOTE — Telephone Encounter (Signed)
-----   Message from [name], MD sent at 01/21/2020 12:33 PM EDT ----- Regarding: follow up Please schedule follow up in 3-4 months Dx: Ischemic colitis  RV

## 2020-01-21 NOTE — Telephone Encounter (Signed)
Put patient in recall list for 3 months

## 2020-01-29 ENCOUNTER — Ambulatory Visit: Payer: Managed Care, Other (non HMO) | Admitting: Gastroenterology

## 2020-04-01 ENCOUNTER — Other Ambulatory Visit: Payer: Self-pay | Admitting: Family Medicine

## 2020-04-01 DIAGNOSIS — Z1231 Encounter for screening mammogram for malignant neoplasm of breast: Secondary | ICD-10-CM

## 2020-04-03 ENCOUNTER — Other Ambulatory Visit: Payer: Self-pay

## 2020-04-03 ENCOUNTER — Ambulatory Visit
Admission: RE | Admit: 2020-04-03 | Discharge: 2020-04-03 | Disposition: A | Discharge status: Home / Self Care | Payer: Managed Care, Other (non HMO) | Source: Ambulatory Visit | Attending: Family Medicine | Admitting: Family Medicine

## 2020-04-03 DIAGNOSIS — Z1231 Encounter for screening mammogram for malignant neoplasm of breast: Secondary | ICD-10-CM | POA: Insufficient documentation

## 2020-05-27 ENCOUNTER — Ambulatory Visit: Payer: Managed Care, Other (non HMO) | Admitting: Gastroenterology

## 2020-08-05 ENCOUNTER — Other Ambulatory Visit: Payer: Self-pay

## 2020-08-05 ENCOUNTER — Ambulatory Visit (INDEPENDENT_AMBULATORY_CARE_PROVIDER_SITE_OTHER): Payer: Managed Care, Other (non HMO) | Admitting: Gastroenterology

## 2020-08-05 ENCOUNTER — Encounter: Payer: Self-pay | Admitting: Gastroenterology

## 2020-08-05 VITALS — BP 122/81 | HR 81 | Temp 98.2°F | Wt 128.5 lb

## 2020-08-05 DIAGNOSIS — K559 Vascular disorder of intestine, unspecified: Secondary | ICD-10-CM

## 2020-08-05 DIAGNOSIS — K5150 Left sided colitis without complications: Secondary | ICD-10-CM

## 2020-08-05 DIAGNOSIS — K5909 Other constipation: Secondary | ICD-10-CM

## 2020-08-05 MED ORDER — MAGNESIUM CITRATE PO SOLN: 1.0000 | Freq: Once | ORAL | 0 refills | Status: AC

## 2020-08-05 MED ORDER — NA SULFATE-K SULFATE-MG SULF 17.5-3.13-1.6 GM/177ML PO SOLN: 354.0000 mL | Freq: Once | ORAL | 0 refills | Status: AC

## 2020-08-05 NOTE — Progress Notes (Signed)
[name], MD [address]  [place]  [place], [place] [address]  Main: [phone]  Fax: [phone]    Gastroenterology Consultation  Referring Provider:     [name], MD Primary Care Physician:  [name], MD Primary Gastroenterologist:  Dr. [name] Reason for Consultation:     Hospital follow-up, left-sided colitis and duodenitis        HPI:   Aimee Stuart is a 64 y.o. female referred by Dr. [name], MD  for consultation & management of recent hospitalization for abdominal pain, nausea, vomiting and obstipation.  CT abdomen and pelvis revealed left-sided colitis.  Patient was empirically treated with antibiotics.  She has history of chronic tobacco use and chronic constipation.  There was no evidence of acute diverticulitis on the CT scan.  Patient is managed conservatively, her ileus resolved.  Subsequently, underwent flexible sigmoidoscopy which revealed severe left-sided colitis.  Biopsies confirmed acute colitis only.  No evidence of chronicity.  She also underwent upper endoscopy which revealed superficial ulcers in the duodenum concerning for ischemic duodenitis.  There was no evidence of H. pylori on gastric biopsies.  Patient is discharged home on 2 weeks course of Cipro and Flagyl and Protonix 40 mg twice daily.  Interval summary Patient reports that she has clinically improved since discharge, she had episodes of diarrhea for few days.  Her appetite has improved.  She reports that her constipation has returned.  She has not resumed smoking for 4 weeks since last hospitalization.  She reports drinking plenty of water.  She reports mild abdominal discomfort only.  She denies nausea or vomiting and has been tolerating p.o. well.  Follow-up is 11-21 Patient reports doing well from GI standpoint.  She is taking MiraLAX daily for constipation and reports having regular bowel movements.  She denies abdominal pain, bloating.  She is no  longer anemic.  She is here to discuss about colonoscopy  NSAIDs: None  Antiplts/Anticoagulants/Anti thrombotics: None  GI Procedures: EGD 12/21/2019 - Non-bleeding duodenal ulcers with a clean ulcer base (Forrest Class III). - Normal second portion of the duodenum. - Small hiatal hernia. - Normal stomach. Biopsied. - LA Grade A reflux esophagitis with no bleeding.  Flexible sigmoidoscopy 12/21/2019 The perianal and digital rectal examinations were normal. Pertinent negatives include normal sphincter tone and no palpable rectal lesions. Tight rectosigmoid area A continuous area of nonbleeding ulcerated mucosa with no stigmata of recent bleeding was present in the sigmoid colon and in the descending colon. Biopsies were taken with a cold forceps for histology to evaluate for ischemic colitis or IBD. Normal mucosa was found in the rectum, rectosigmoid and in the proximal descending colon. Biopsies from normal appearing mucosa in proximal descending colon were taken with a cold forceps for histology.  DIAGNOSIS:  A. STOMACH; RANDOM COLD BIOPSY:  - OXYNTIC AND TRANSITIONAL MUCOSA WITHOUT PATHOLOGIC CHANGES.  - NEGATIVE FOR H. PYLORI, INTESTINAL METAPLASIA, DYSPLASIA, AND  MALIGNANCY.   B. LEFT COLON NORMAL MUCOSA; COLD BIOPSY:  - ONE 2 MM FRAGMENT OF PARTIAL THICKNESS MUCOSA, APPEARS TO BE  ASSOCIATED WITH ACUTE INFLAMMATORY EXUDATE.  - ONE 4 MM FRAGMENT OF UNREMARKABLE COLONIC MUCOSA.   C. LEFT COLON ULCERATED MUCOSA; COLD BIOPSY:  - ONE 2 MM FRAGMENT OF FLATTENED MUCOSA WITH A CRYPT ABSCESS.  - DETACHED FRAGMENT OF ACUTE INFLAMMATORY EXUDATE.  - FRAGMENTS OF UNREMARKABLE COLONIC MUCOSA.  Past Medical History:  Diagnosis Date  . Acute renal failure (HCC)    due  to bladder damage during hysterectomy  . Insomnia   . Tobacco use 05/02/2018  . Wears contact lenses    sometimes    Past Surgical History:  Procedure Laterality Date  . ABDOMINAL HYSTERECTOMY  2013   [facility]  .  BLADDER REPAIR     damaged during hysterectomy  . CERVICAL BIOPSY  W/ LOOP ELECTRODE EXCISION  07/2008  . CESAREAN SECTION    . COLONOSCOPY WITH PROPOFOL N/A 12/20/2016   Procedure: COLONOSCOPY WITH PROPOFOL;  Surgeon: [name], MD;  Location: [facility];  Service: Endoscopy;  Laterality: N/A;  . COLPOSCOPY  2009  . ESOPHAGOGASTRODUODENOSCOPY N/A 12/21/2019   Procedure: ESOPHAGOGASTRODUODENOSCOPY (EGD);  Surgeon: [name], MD;  Location[facility] ENDOSCOPY;  Service: Gastroenterology;  Laterality: N/A;  . FLEXIBLE SIGMOIDOSCOPY N/A 12/21/2019   Procedure: FLEXIBLE SIGMOIDOSCOPY;  Surgeon: [name], MD;  Location: [facility] ENDOSCOPY;  Service: Gastroenterology;  Laterality: N/A;  . KNEE SURGERY Right    age 25     Current Outpatient Medications:  .  buPROPion (WELLBUTRIN XL) 150 MG 24 hr tablet, Take 150 mg by mouth daily., Disp: , Rfl:  .  clonazePAM (KLONOPIN) 0.5 MG tablet, Take 0.25 mg by mouth at bedtime as needed for anxiety., Disp: , Rfl:  .  Multiple Vitamin (MULTIVITAMIN WITH MINERALS) TABS tablet, Take 1 tablet by mouth daily., Disp: , Rfl:  .  pantoprazole (PROTONIX) 40 MG tablet, Take 1 tablet (40 mg total) by mouth 2 (two) times daily before a meal., Disp: 60 tablet, Rfl: 0 .  traZODone (DESYREL) 100 MG tablet, TAKE 1 TABLET BY MOUTH EVERYDAY AT BEDTIME (Patient taking differently: Take 100 mg by mouth at bedtime. ), Disp: 90 tablet, Rfl: 2 .  magnesium citrate SOLN, Take 296 mLs (1 Bottle total) by mouth once for 1 dose., Disp: 195 mL, Rfl: 0 .  Na Sulfate-K Sulfate-Mg Sulf 17.5-3.13-1.6 GM/177ML SOLN, Take 354 mLs by mouth once for 1 dose., Disp: 354 mL, Rfl: 0   Family History  Problem Relation Age of Onset  . Alzheimer's disease Mother   . Cancer Father 63       prostate  . Heart disease Father   . Breast cancer Cousin        mat cousin     Social History   Tobacco Use  . Smoking status: Current Every Day Smoker    Packs/day: 0.50     Types: Cigarettes  . Smokeless tobacco: Never Used  Vaping Use  . Vaping Use: Never used  Substance Use Topics  . Alcohol use: Yes    Alcohol/week: 0.0 standard drinks  . Drug use: No    Allergies as of 08/05/2020  . (No Known Allergies)    Review of Systems:    All systems reviewed and negative except where noted in HPI.   Physical Exam:  BP 122/81 (BP Location: Left Arm, Patient Position: Sitting, Cuff Size: Normal)   Pulse 81   Temp 98.2 F (36.8 C) (Oral)   Wt 128 lb 8 oz (58.3 kg)   BMI 19.54 kg/m  No LMP recorded. Patient has had a hysterectomy.  General:   Alert,  Well-developed, well-nourished, pleasant and cooperative in NAD Head:  Normocephalic and atraumatic. Eyes:  Sclera clear, no icterus.   Conjunctiva pink. Ears:  Normal auditory acuity. Nose:  No deformity, discharge, or lesions. Mouth:  No deformity or lesions,oropharynx pink & moist. Neck:  Supple; no masses or thyromegaly. Lungs:  Respirations even and unlabored.  Clear  throughout to auscultation.   No wheezes, crackles, or rhonchi. No acute distress. Heart:  Regular rate and rhythm; no murmurs, clicks, rubs, or gallops. Abdomen:  Normal bowel sounds. Soft, non-tender and non-distended without masses, hepatosplenomegaly or hernias noted.  No guarding or rebound tenderness.   Rectal: Not performed Msk:  Symmetrical without gross deformities. Good, equal movement & strength bilaterally. Pulses:  Normal pulses noted. Extremities:  No clubbing or edema.  No cyanosis. Neurologic:  Alert and oriented x3;  grossly normal neurologically. Skin:  Intact without significant lesions or rashes. No jaundice. Psych:  Alert and cooperative. Normal mood and affect.  Imaging Studies: Reviewed  Assessment and Plan:   LAMIRACLE CHAIDEZ is a 64 y.o. female with history of chronic tobacco use, chronic constipation, left-sided colitis in 12/2019, flexible sigmoidoscopy confirmed severe left-sided colitis, pathology revealed  severe acute colitis only.  She also had superficial duodenal ulcers.  CT abdomen and pelvis with contrast revealed aortic atherosclerosis, no evidence of focal narrowing or stenosis of mesenteric vasculature.  Currently asymptomatic and weight has been stable  Left-sided colitis, most likely ischemic colitis secondary to dehydration, tobacco use Recommend colonoscopy with TI evaluation to confirm healing and repeat biopsies to rule out inflammatory bowel disease I will hold off on CT angio at this time as CT abdomen pelvis with contrast revealed abdominal atherosclerosis only Continue to avoid smoking  Chronic constipation Continue high-fiber diet, MiraLAX daily Adequate intake of water  Duodenal ulcers, likely secondary to ischemia No evidence of H. pylori Continue Protonix 40 mg 1-2 times daily Normal serum gastrin levels   Follow up as needed   [name], MD

## 2020-08-12 ENCOUNTER — Encounter: Payer: Self-pay | Admitting: Gastroenterology

## 2020-08-14 ENCOUNTER — Telehealth: Payer: Self-pay

## 2020-08-14 NOTE — Telephone Encounter (Signed)
Returned patients call. Informed patient she can go anytime between the hours of 8am to 1pm. LVM

## 2020-08-19 ENCOUNTER — Other Ambulatory Visit
Admission: RE | Admit: 2020-08-19 | Discharge: 2020-08-19 | Disposition: A | Discharge status: Home / Self Care | Payer: Managed Care, Other (non HMO) | Source: Ambulatory Visit | Attending: Gastroenterology | Admitting: Gastroenterology

## 2020-08-19 ENCOUNTER — Other Ambulatory Visit: Payer: Self-pay

## 2020-08-19 DIAGNOSIS — Z01812 Encounter for preprocedural laboratory examination: Secondary | ICD-10-CM | POA: Insufficient documentation

## 2020-08-19 DIAGNOSIS — Z20822 Contact with and (suspected) exposure to covid-19: Secondary | ICD-10-CM | POA: Diagnosis not present

## 2020-08-19 LAB — SARS CORONAVIRUS 2 (TAT 6-24 HRS): SARS Coronavirus 2: NEGATIVE

## 2020-08-20 NOTE — Discharge Instructions (Signed)
General Anesthesia, Adult, Care After This sheet gives you information about how to care for yourself after your procedure. Your health care provider may also give you more specific instructions. If you have problems or questions, contact your health care provider. What can I expect after the procedure? After the procedure, the following side effects are common:  Pain or discomfort at the IV site.  Nausea.  Vomiting.  Sore throat.  Trouble concentrating.  Feeling cold or chills.  Weak or tired.  Sleepiness and fatigue.  Soreness and body aches. These side effects can affect parts of the body that were not involved in surgery. Follow these instructions at home:  For at least 24 hours after the procedure:  Have a responsible adult stay with you. It is important to have someone help care for you until you are awake and alert.  Rest as needed.  Do not: ? Participate in activities in which you could fall or become injured. ? Drive. ? Use heavy machinery. ? Drink alcohol. ? Take sleeping pills or medicines that cause drowsiness. ? Make important decisions or sign legal documents. ? Take care of children on your own. Eating and drinking  Follow any instructions from your health care provider about eating or drinking restrictions.  When you feel hungry, start by eating small amounts of foods that are soft and easy to digest (bland), such as toast. Gradually return to your regular diet.  Drink enough fluid to keep your urine pale yellow.  If you vomit, rehydrate by drinking water, juice, or clear broth. General instructions  If you have sleep apnea, surgery and certain medicines can increase your risk for breathing problems. Follow instructions from your health care provider about wearing your sleep device: ? Anytime you are sleeping, including during daytime naps. ? While taking prescription pain medicines, sleeping medicines, or medicines that make you drowsy.  Return to  your normal activities as told by your health care provider. Ask your health care provider what activities are safe for you.  Take over-the-counter and prescription medicines only as told by your health care provider.  If you smoke, do not smoke without supervision.  Keep all follow-up visits as told by your health care provider. This is important. Contact a health care provider if:  You have nausea or vomiting that does not get better with medicine.  You cannot eat or drink without vomiting.  You have pain that does not get better with medicine.  You are unable to pass urine.  You develop a skin rash.  You have a fever.  You have redness around your IV site that gets worse. Get help right away if:  You have difficulty breathing.  You have chest pain.  You have blood in your urine or stool, or you vomit blood. Summary  After the procedure, it is common to have a sore throat or nausea. It is also common to feel tired.  Have a responsible adult stay with you for the first 24 hours after general anesthesia. It is important to have someone help care for you until you are awake and alert.  When you feel hungry, start by eating small amounts of foods that are soft and easy to digest (bland), such as toast. Gradually return to your regular diet.  Drink enough fluid to keep your urine pale yellow.  Return to your normal activities as told by your health care provider. Ask your health care provider what activities are safe for you. This information is not   intended to replace advice given to you by your health care provider. Make sure you discuss any questions you have with your health care provider. Document Revised: 09/23/2017 Document Reviewed: 05/06/2017 Elsevier Patient Education  2020 Elsevier Inc.  

## 2020-08-21 ENCOUNTER — Ambulatory Visit
Admission: RE | Admit: 2020-08-21 | Discharge: 2020-08-21 | Disposition: A | Discharge status: Home / Self Care | Payer: Managed Care, Other (non HMO) | Attending: Gastroenterology | Admitting: Gastroenterology

## 2020-08-21 ENCOUNTER — Ambulatory Visit: Payer: Managed Care, Other (non HMO) | Admitting: Anesthesiology

## 2020-08-21 ENCOUNTER — Encounter
Admission: RE | Disposition: A | Discharge status: Home / Self Care | Payer: Self-pay | Source: Home / Self Care | Attending: Gastroenterology

## 2020-08-21 ENCOUNTER — Other Ambulatory Visit: Payer: Self-pay

## 2020-08-21 ENCOUNTER — Encounter: Payer: Self-pay | Admitting: Gastroenterology

## 2020-08-21 DIAGNOSIS — Z87891 Personal history of nicotine dependence: Secondary | ICD-10-CM | POA: Insufficient documentation

## 2020-08-21 DIAGNOSIS — K5150 Left sided colitis without complications: Secondary | ICD-10-CM

## 2020-08-21 DIAGNOSIS — Z09 Encounter for follow-up examination after completed treatment for conditions other than malignant neoplasm: Secondary | ICD-10-CM | POA: Diagnosis not present

## 2020-08-21 DIAGNOSIS — Z79899 Other long term (current) drug therapy: Secondary | ICD-10-CM | POA: Diagnosis not present

## 2020-08-21 DIAGNOSIS — Z7982 Long term (current) use of aspirin: Secondary | ICD-10-CM | POA: Insufficient documentation

## 2020-08-21 DIAGNOSIS — Z8719 Personal history of other diseases of the digestive system: Secondary | ICD-10-CM | POA: Insufficient documentation

## 2020-08-21 HISTORY — PX: COLONOSCOPY WITH PROPOFOL: SHX5780

## 2020-08-21 SURGERY — COLONOSCOPY WITH PROPOFOL
Anesthesia: General | Site: Rectum

## 2020-08-21 MED ORDER — PROPOFOL 10 MG/ML IV BOLUS
INTRAVENOUS | Status: DC | PRN
  Administered 2020-08-21: 50 mg via INTRAVENOUS
  Administered 2020-08-21: 30 mg via INTRAVENOUS
  Administered 2020-08-21: 60 mg via INTRAVENOUS

## 2020-08-21 MED ORDER — STERILE WATER FOR IRRIGATION IR SOLN: Status: DC | PRN

## 2020-08-21 MED ORDER — ACETAMINOPHEN 160 MG/5ML PO SOLN: 325.0000 mg | ORAL | Status: DC | PRN

## 2020-08-21 MED ORDER — LIDOCAINE HCL (CARDIAC) PF 100 MG/5ML IV SOSY
PREFILLED_SYRINGE | INTRAVENOUS | Status: DC | PRN
  Administered 2020-08-21: 40 mg via INTRAVENOUS

## 2020-08-21 MED ORDER — ACETAMINOPHEN 325 MG PO TABS: 325.0000 mg | ORAL_TABLET | ORAL | Status: DC | PRN

## 2020-08-21 MED ORDER — LACTATED RINGERS IV SOLN: INTRAVENOUS | Status: DC

## 2020-08-21 SURGICAL SUPPLY — 6 items
GOWN CVR UNV OPN BCK APRN NK (MISCELLANEOUS) ×2 IMPLANT
GOWN ISOL THUMB LOOP REG UNIV (MISCELLANEOUS) ×6
KIT PRC NS LF DISP ENDO (KITS) ×1 IMPLANT
KIT PROCEDURE OLYMPUS (KITS) ×3
MANIFOLD NEPTUNE II (INSTRUMENTS) ×3 IMPLANT
WATER STERILE IRR 250ML POUR (IV SOLUTION) ×3 IMPLANT

## 2020-08-21 NOTE — Op Note (Signed)
[facility] Gastroenterology Patient Name: Aimee Stuart Procedure Date: 08/21/2020 9:50 AM MRN: [id number] Account #: [id number] Date of Birth: 1956-04-06 Admit Type: Outpatient Age: 64 Room: [facility] OR ROOM 01 Gender: Female Note Status: Finalized Procedure:             Colonoscopy Indications:           Last colonoscopy: March 2018, Follow-up of left sided                         colitis Providers:             [name] MD, MD Referring MD:          Caprice Renshaw MD (Referring MD) Medicines:             General Anesthesia Complications:         No immediate complications. Estimated blood loss: None. Procedure:             Pre-Anesthesia Assessment:                        - Prior to the procedure, a History and Physical was                         performed, and patient medications and allergies were                         reviewed. The patient is competent. The risks and                         benefits of the procedure and the sedation options and                         risks were discussed with the patient. All questions                         were answered and informed consent was obtained.                         Patient identification and proposed procedure were                         verified by the physician, the nurse, the                         anesthesiologist, the anesthetist and the technician                         in the pre-procedure area in the procedure room in the                         endoscopy suite. Mental Status Examination: alert and                         oriented. Airway Examination: normal oropharyngeal                         airway and neck mobility. Respiratory Examination:  clear to auscultation. CV Examination: normal.                         Prophylactic Antibiotics: The patient does not require                         prophylactic antibiotics. Prior Anticoagulants: The                          patient has taken no previous anticoagulant or                         antiplatelet agents. ASA Grade Assessment: II - A                         patient with mild systemic disease. After reviewing                         the risks and benefits, the patient was deemed in                         satisfactory condition to undergo the procedure. The                         anesthesia plan was to use general anesthesia.                         Immediately prior to administration of medications,                         the patient was re-assessed for adequacy to receive                         sedatives. The heart rate, respiratory rate, oxygen                         saturations, blood pressure, adequacy of pulmonary                         ventilation, and response to care were monitored                         throughout the procedure. The physical status of the                         patient was re-assessed after the procedure.                        After obtaining informed consent, the colonoscope was                         passed under direct vision. Throughout the procedure,                         the patient's blood pressure, pulse, and oxygen                         saturations were monitored continuously. The was  introduced through the anus and advanced to the the                         terminal ileum, with identification of the appendiceal                         orifice and IC valve. The colonoscopy was performed                         with moderate difficulty due to significant looping.                         Successful completion of the procedure was aided by                         applying abdominal pressure. The patient tolerated the                         procedure well. The quality of the bowel preparation                         was evaluated using the BBPS [facility] Bowel Preparation                         Scale) with scores of: Right  Colon = 3, Transverse                         Colon = 3 and Left Colon = 3 (entire mucosa seen well                         with no residual staining, small fragments of stool or                         opaque liquid). The total BBPS score equals 9. Findings:      The perianal and digital rectal examinations were normal. Pertinent       negatives include normal sphincter tone and no palpable rectal lesions.      The terminal ileum appeared normal.      The colon (entire examined portion) appeared normal. Impression:            - The examined portion of the ileum was normal.                        - The entire examined colon is normal.                        - No specimens collected. Recommendation:        - Discharge patient to home (with escort).                        - Resume previous diet today.                        - Continue present medications.                        - Repeat colonoscopy in 10 years for surveillance. Procedure Code(s):     ---  Professional ---                        [phone], Colonoscopy, flexible; diagnostic, including                         collection of specimen(s) by brushing or washing, when                         performed (separate procedure) Diagnosis Code(s):     --- Professional ---                        K51.90, Ulcerative colitis, unspecified, without                         complications CPT copyright 2019 American Medical Association. All rights reserved. The codes documented in this report are preliminary and upon coder review may  be revised to meet current compliance requirements. Dr. Ulyess Mort [name] MD, MD 08/21/2020 10:25:41 AM This report has been signed electronically. Number of Addenda: 0 Note Initiated On: 08/21/2020 9:50 AM Scope Withdrawal Time: 0 hours 7 minutes 54 seconds  Total Procedure Duration: 0 hours 16 minutes 34 seconds  Estimated Blood Loss:  Estimated blood loss: none.      [facility]

## 2020-08-21 NOTE — Anesthesia Postprocedure Evaluation (Signed)
Anesthesia Post Note  Patient: Aimee Stuart  Procedure(s) Performed: COLONOSCOPY WITH PROPOFOL (N/A Rectum)     Patient location during evaluation: PACU Anesthesia Type: General Level of consciousness: awake and alert Pain management: pain level controlled Vital Signs Assessment: post-procedure vital signs reviewed and stable Respiratory status: spontaneous breathing, nonlabored ventilation, respiratory function stable and patient connected to nasal cannula oxygen Cardiovascular status: blood pressure returned to baseline and stable Postop Assessment: no apparent nausea or vomiting Anesthetic complications: no   No complications documented.  Trecia Rogers

## 2020-08-21 NOTE — H&P (Signed)
[name], MD [address]  [place]  [place], [place] [address]  Main: [phone]  Fax: [phone] Pager: [phone]  Primary Care Physician:  [name], MD Primary Gastroenterologist:  Dr. [name]  Pre-Procedure History & Physical: HPI:  Aimee Stuart is a 64 y.o. female is here for an colonoscopy.   Past Medical History:  Diagnosis Date  . Acute renal failure (HCC)    due to bladder damage during hysterectomy  . Insomnia   . Tobacco use 05/02/2018  . Wears contact lenses    sometimes    Past Surgical History:  Procedure Laterality Date  . ABDOMINAL HYSTERECTOMY  2013   [facility]  . BLADDER REPAIR     damaged during hysterectomy  . CERVICAL BIOPSY  W/ LOOP ELECTRODE EXCISION  07/2008  . CESAREAN SECTION    . COLONOSCOPY WITH PROPOFOL N/A 12/20/2016   Procedure: COLONOSCOPY WITH PROPOFOL;  Surgeon: [name], MD;  Location: [facility];  Service: Endoscopy;  Laterality: N/A;  . COLPOSCOPY  2009  . ESOPHAGOGASTRODUODENOSCOPY N/A 12/21/2019   Procedure: ESOPHAGOGASTRODUODENOSCOPY (EGD);  Surgeon: [name], MD;  Location: [facility] ENDOSCOPY;  Service: Gastroenterology;  Laterality: N/A;  . FLEXIBLE SIGMOIDOSCOPY N/A 12/21/2019   Procedure: FLEXIBLE SIGMOIDOSCOPY;  Surgeon: [name], MD;  Location: [facility] ENDOSCOPY;  Service: Gastroenterology;  Laterality: N/A;  . KNEE SURGERY Right    age 41    Prior to Admission medications   Medication Sig Start Date End Date Taking? Authorizing Provider  aspirin EC 81 MG tablet Take 81 mg by mouth daily. Swallow whole.   Yes [provider]  buPROPion (WELLBUTRIN XL) 150 MG 24 hr tablet Take 150 mg by mouth daily. 11/17/19  Yes [provider]  clonazePAM (KLONOPIN) 0.5 MG tablet Take 0.25 mg by mouth at bedtime as needed for anxiety.   Yes [provider]  Multiple Vitamin (MULTIVITAMIN WITH MINERALS) TABS tablet Take 1 tablet by mouth daily.   Yes [provider]  pantoprazole (PROTONIX) 40 MG tablet Take 1 tablet (40 mg total) by mouth 2 (two) times daily before a meal. 12/24/19  Yes [name], MD  traZODone (DESYREL) 100 MG tablet TAKE 1 TABLET BY MOUTH EVERYDAY AT BEDTIME Patient taking differently: Take 100 mg by mouth at bedtime.  07/01/18  Yes [name], MD    Allergies as of 08/05/2020  . (No Known Allergies)    Family History  Problem Relation Age of Onset  . Alzheimer's disease Mother   . Cancer Father 15       prostate  . Heart disease Father   . Breast cancer Cousin        mat cousin    Social History   Socioeconomic History  . Marital status: Married    Spouse name: Not on file  . Number of children: Not on file  . Years of education: Not on file  . Highest education level: Not on file  Occupational History  . Not on file  Tobacco Use  . Smoking status: Former Smoker    Packs/day: 0.50    Types: Cigarettes    Quit date: 12/18/2019    Years since quitting: 0.6  . Smokeless tobacco: Never Used  Vaping Use  . Vaping Use: Never used  Substance and Sexual Activity  . Alcohol use: Yes    Alcohol/week: 0.0 standard drinks  . Drug use: No  . Sexual activity: Never  Other Topics Concern  . Not  on file  Social History Narrative  . Not on file   Social Determinants of Health   Financial Resource Strain:   . Difficulty of Paying Living Expenses: Not on file  Food Insecurity:   . Worried About Charity fundraiser in the Last Year: Not on file  . Ran Out of Food in the Last Year: Not on file  Transportation Needs:   . Lack of Transportation (Medical): Not on file  . Lack of Transportation (Non-Medical): Not on file  Physical Activity:   . Days of Exercise per Week: Not on file  . Minutes of Exercise per Session: Not on file  Stress:   . Feeling of Stress : Not on file  Social Connections:   . Frequency of Communication with Friends and Family: Not on file  . Frequency of Social Gatherings  with Friends and Family: Not on file  . Attends Religious Services: Not on file  . Active Member of Clubs or Organizations: Not on file  . Attends Archivist Meetings: Not on file  . Marital Status: Not on file  Intimate Partner Violence:   . Fear of Current or Ex-Partner: Not on file  . Emotionally Abused: Not on file  . Physically Abused: Not on file  . Sexually Abused: Not on file    Review of Systems: See HPI, otherwise negative ROS  Physical Exam: BP 140/88   Pulse 79   Temp (!) 97.3 F (36.3 C) (Temporal)   Resp 16   Ht 5' 8"  (1.727 m)   Wt 57.2 kg   SpO2 100%   BMI 19.16 kg/m  General:   Alert,  pleasant and cooperative in NAD Head:  Normocephalic and atraumatic. Neck:  Supple; no masses or thyromegaly. Lungs:  Clear throughout to auscultation.    Heart:  Regular rate and rhythm. Abdomen:  Soft, nontender and nondistended. Normal bowel sounds, without guarding, and without rebound.   Neurologic:  Alert and  oriented x4;  grossly normal neurologically.  Impression/Plan: Aimee Stuart is here for an colonoscopy to be performed for h/o colitis  Risks, benefits, limitations, and alternatives regarding  colonoscopy have been reviewed with the patient.  Questions have been answered.  All parties agreeable.   [name], MD  08/21/2020, 9:54 AM

## 2020-08-21 NOTE — Anesthesia Preprocedure Evaluation (Signed)
Anesthesia Evaluation  Patient identified by MRN, date of birth, ID band Patient awake    Reviewed: Allergy & Precautions, H&P , NPO status , Patient's Chart, lab work & pertinent test results, reviewed documented beta blocker date and time   Airway Mallampati: II  TM Distance: >3 FB Neck ROM: full    Dental no notable dental hx.    Pulmonary former smoker,    Pulmonary exam normal breath sounds clear to auscultation       Cardiovascular Exercise Tolerance: Good negative cardio ROS Normal cardiovascular exam Rhythm:regular Rate:Normal     Neuro/Psych negative neurological ROS  negative psych ROS   GI/Hepatic Neg liver ROS, GERD  Medicated,  Endo/Other  negative endocrine ROS  Renal/GU Renal diseaseHx of acute renal failure with hysterectomy  negative genitourinary   Musculoskeletal   Abdominal   Peds  Hematology negative hematology ROS (+)   Anesthesia Other Findings   Reproductive/Obstetrics negative OB ROS                             Anesthesia Physical Anesthesia Plan  ASA: II  Anesthesia Plan: General   Post-op Pain Management:    Induction:   PONV Risk Score and Plan:   Airway Management Planned:   Additional Equipment:   Intra-op Plan:   Post-operative Plan:   Informed Consent: I have reviewed the patients History and Physical, chart, labs and discussed the procedure including the risks, benefits and alternatives for the proposed anesthesia with the patient or authorized representative who has indicated his/her understanding and acceptance.     Dental Advisory Given  Plan Discussed with: CRNA  Anesthesia Plan Comments:         Anesthesia Quick Evaluation

## 2020-08-21 NOTE — Anesthesia Procedure Notes (Signed)
Procedure Name: MAC Date/Time: 08/21/2020 10:08 AM Performed by: Silvana Newness, CRNA Pre-anesthesia Checklist: Patient identified, Emergency Drugs available, Suction available, Patient being monitored and Timeout performed Patient Re-evaluated:Patient Re-evaluated prior to induction Oxygen Delivery Method: Nasal cannula Placement Confirmation: positive ETCO2

## 2020-08-21 NOTE — Transfer of Care (Signed)
Immediate Anesthesia Transfer of Care Note  Patient: Aimee Stuart  Procedure(s) Performed: COLONOSCOPY WITH PROPOFOL (N/A Rectum)  Patient Location: PACU  Anesthesia Type: General  Level of Consciousness: awake, alert  and patient cooperative  Airway and Oxygen Therapy: Patient Spontanous Breathing and Patient connected to supplemental oxygen  Post-op Assessment: Post-op Vital signs reviewed, Patient's Cardiovascular Status Stable, Respiratory Function Stable, Patent Airway and No signs of Nausea or vomiting  Post-op Vital Signs: Reviewed and stable  Complications: No complications documented.

## 2020-08-22 ENCOUNTER — Encounter: Payer: Self-pay | Admitting: Gastroenterology

## 2020-09-17 ENCOUNTER — Other Ambulatory Visit: Payer: Managed Care, Other (non HMO)

## 2020-09-17 ENCOUNTER — Other Ambulatory Visit: Payer: Self-pay

## 2020-09-17 DIAGNOSIS — Z13220 Encounter for screening for lipoid disorders: Secondary | ICD-10-CM

## 2020-09-17 DIAGNOSIS — Z79899 Other long term (current) drug therapy: Secondary | ICD-10-CM | POA: Diagnosis not present

## 2020-09-17 LAB — COMPREHENSIVE METABOLIC PANEL
ALT: 12 IU/L (ref 0–32)
AST: 20 IU/L (ref 0–40)
Albumin/Globulin Ratio: 2.2 (ref 1.2–2.2)
Albumin: 4.2 g/dL (ref 3.8–4.8)
Alkaline Phosphatase: 93 IU/L (ref 44–121)
BUN/Creatinine Ratio: 14 (ref 12–28)
BUN: 13 mg/dL (ref 8–27)
Bilirubin Total: 0.4 mg/dL (ref 0.0–1.2)
CO2: 20 mmol/L (ref 20–29)
Calcium: 9.5 mg/dL (ref 8.7–10.3)
Chloride: 105 mmol/L (ref 96–106)
Creatinine, Ser: 0.96 mg/dL (ref 0.57–1.00)
GFR calc Af Amer: 72 mL/min/{1.73_m2} (ref >59)
GFR calc non Af Amer: 63 mL/min/{1.73_m2} (ref >59)
Globulin, Total: 1.9 g/dL (ref 1.5–4.5)
Glucose: 79 mg/dL (ref 65–99)
Potassium: 4.4 mmol/L (ref 3.5–5.2)
Sodium: 143 mmol/L (ref 134–144)
Total Protein: 6.1 g/dL (ref 6.0–8.5)

## 2020-09-17 LAB — LIPID PANEL
Chol/HDL Ratio: 2.8 ratio (ref 0.0–4.4)
Cholesterol, Total: 166 mg/dL (ref 100–199)
HDL: 60 mg/dL (ref >39)
LDL Chol Calc (NIH): 86 mg/dL (ref 0–99)
Triglycerides: 111 mg/dL (ref 0–149)
VLDL Cholesterol Cal: 20 mg/dL (ref 5–40)

## 2020-09-17 LAB — CBC WITH DIFFERENTIAL/PLATELET
Basophils Absolute: 0.1 10*3/uL (ref 0.0–0.2)
Basos: 2 %
EOS (ABSOLUTE): 1 10*3/uL — ABNORMAL HIGH (ref 0.0–0.4)
Eos: 17 %
Hematocrit: 37.2 % (ref 34.0–46.6)
Hemoglobin: 12.4 g/dL (ref 11.1–15.9)
Immature Grans (Abs): 0 10*3/uL (ref 0.0–0.1)
Immature Granulocytes: 0 %
Lymphocytes Absolute: 1.6 10*3/uL (ref 0.7–3.1)
Lymphs: 27 %
MCH: 29.7 pg (ref 26.6–33.0)
MCHC: 33.3 g/dL (ref 31.5–35.7)
MCV: 89 fL (ref 79–97)
Monocytes Absolute: 0.5 10*3/uL (ref 0.1–0.9)
Monocytes: 9 %
Neutrophils Absolute: 2.7 10*3/uL (ref 1.4–7.0)
Neutrophils: 45 %
Platelets: 241 10*3/uL (ref 150–450)
RBC: 4.17 x10E6/uL (ref 3.77–5.28)
RDW: 11.8 % (ref 11.7–15.4)
WBC: 5.9 10*3/uL (ref 3.4–10.8)

## 2021-06-24 ENCOUNTER — Other Ambulatory Visit: Payer: Self-pay | Admitting: Family Medicine

## 2021-06-24 DIAGNOSIS — Z1231 Encounter for screening mammogram for malignant neoplasm of breast: Secondary | ICD-10-CM

## 2021-07-09 ENCOUNTER — Ambulatory Visit: Payer: Managed Care, Other (non HMO)

## 2021-11-10 ENCOUNTER — Telehealth: Payer: Self-pay

## 2021-11-10 NOTE — Telephone Encounter (Signed)
Patient made follow up appointment for 12/09/2021 in [place]. Patient has been having lower abdominal pain goes back and forth between sizes. Patient has had dry mouth and is drinking more water. Patient is walking 20 to 25 minutes a day. She has had mild constipation. Patient has had some nausea but not bad. States she does not want her symptoms to get as bad as last year when she ended up in the hospital

## 2021-11-10 NOTE — Telephone Encounter (Signed)
Patient left a voicemail stating she is having abdominal pain again and was seen 1 year ago. Left a message for call back

## 2021-11-10 NOTE — Telephone Encounter (Signed)
Patient verbalized understanding of instructions  

## 2021-11-10 NOTE — Telephone Encounter (Signed)
Continue to stay hydrated, take aspirin 81 mg daily.  Have her take stool softener such as MiraLAX half cup to 1 cup daily to keep bowels regular.  Eliminate all the processed foods, red meat until the pain subsides.  Her colonoscopy was normal in 08/2020  She should let us know if the pain is not improving  RV

## 2021-11-17 ENCOUNTER — Encounter: Payer: Self-pay | Admitting: Gastroenterology

## 2021-11-17 MED ORDER — ONDANSETRON HCL 4 MG PO TABS: 4.0000 mg | ORAL_TABLET | Freq: Three times a day (TID) | ORAL | 1 refills | Status: AC | PRN

## 2021-11-17 NOTE — Telephone Encounter (Signed)
Sent medication to the pharmacy. Patient made appointment for 12:45pm in [facility]

## 2021-11-17 NOTE — Telephone Encounter (Signed)
Called and left a message for call back  

## 2021-11-19 ENCOUNTER — Ambulatory Visit: Payer: Medicare HMO | Admitting: Gastroenterology

## 2021-11-19 ENCOUNTER — Other Ambulatory Visit: Payer: Self-pay

## 2021-11-19 ENCOUNTER — Encounter: Payer: Self-pay | Admitting: Gastroenterology

## 2021-11-19 VITALS — BP 120/78 | HR 83 | Temp 97.5°F | Ht 68.0 in | Wt 133.4 lb

## 2021-11-19 DIAGNOSIS — R140 Abdominal distension (gaseous): Secondary | ICD-10-CM

## 2021-11-19 DIAGNOSIS — R748 Abnormal levels of other serum enzymes: Secondary | ICD-10-CM

## 2021-11-19 NOTE — Progress Notes (Signed)
[name], MD [address]  [place]  [place], [place] [address]  Main: [phone]  Fax: [phone]    Gastroenterology Consultation  Referring Provider:     [name], MD Primary Care Physician:  [name], MD Primary Gastroenterologist:  Dr. [name] Reason for Consultation:     Hospital follow-up, left-sided colitis and duodenitis        HPI:   Aimee Stuart is a 66 y.o. female referred by Dr. [name], MD  for consultation & management of recent hospitalization for abdominal pain, nausea, vomiting and obstipation.  CT abdomen and pelvis revealed left-sided colitis.  Patient was empirically treated with antibiotics.  She has history of chronic tobacco use and chronic constipation.  There was no evidence of acute diverticulitis on the CT scan.  Patient is managed conservatively, her ileus resolved.  Subsequently, underwent flexible sigmoidoscopy which revealed severe left-sided colitis.  Biopsies confirmed acute colitis only.  No evidence of chronicity.  She also underwent upper endoscopy which revealed superficial ulcers in the duodenum concerning for ischemic duodenitis.  There was no evidence of H. pylori on gastric biopsies.  Patient is discharged home on 2 weeks course of Cipro and Flagyl and Protonix 40 mg twice daily.  Interval summary Patient reports that she has clinically improved since discharge, she had episodes of diarrhea for few days.  Her appetite has improved.  She reports that her constipation has returned.  She has not resumed smoking for 4 weeks since last hospitalization.  She reports drinking plenty of water.  She reports mild abdominal discomfort only.  She denies nausea or vomiting and has been tolerating p.o. well.  Follow-up is 08/05/20 Patient reports doing well from GI standpoint.  She is taking MiraLAX daily for constipation and reports having regular bowel movements.  She denies abdominal pain, bloating.  She is no  longer anemic.  She is here to discuss about colonoscopy  Follow-up visit 11/19/2021 Patient made an urgent visit because of abdominal discomfort, abdominal bloating.  Patient Nausea associated with abdominal discomfort but denies any vomiting.  She called our office on 09/09/2022 secondary to lower abdominal pain.  I have advised her to stay hydrated, plenty of fluids, MiraLAX daily.  She reports that she is having this daily which are loose on MiraLAX.  She continues to be bloated.  She had labs by her PCP yesterday, CBC normal, CMP revealed elevated alkaline phosphatase 172, urine analysis normal.  She is able to maintain weight  NSAIDs: None  Antiplts/Anticoagulants/Anti thrombotics: None  GI Procedures: EGD 12/21/2019 - Non-bleeding duodenal ulcers with a clean ulcer base (Forrest Class III). - Normal second portion of the duodenum. - Small hiatal hernia. - Normal stomach. Biopsied. - LA Grade A reflux esophagitis with no bleeding.  Flexible sigmoidoscopy 12/21/2019 The perianal and digital rectal examinations were normal. Pertinent negatives include normal sphincter tone and no palpable rectal lesions. Tight rectosigmoid area A continuous area of nonbleeding ulcerated mucosa with no stigmata of recent bleeding was present in the sigmoid colon and in the descending colon. Biopsies were taken with a cold forceps for histology to evaluate for ischemic colitis or IBD. Normal mucosa was found in the rectum, rectosigmoid and in the proximal descending colon. Biopsies from normal appearing mucosa in proximal descending colon were taken with a cold forceps for histology.  DIAGNOSIS:  A. STOMACH; RANDOM COLD BIOPSY:  - OXYNTIC AND TRANSITIONAL MUCOSA WITHOUT PATHOLOGIC CHANGES.  - NEGATIVE FOR H. PYLORI,  INTESTINAL METAPLASIA, DYSPLASIA, AND  MALIGNANCY.   B. LEFT COLON NORMAL MUCOSA; COLD BIOPSY:  - ONE 2 MM FRAGMENT OF PARTIAL THICKNESS MUCOSA, APPEARS TO BE  ASSOCIATED WITH ACUTE  INFLAMMATORY EXUDATE.  - ONE 4 MM FRAGMENT OF UNREMARKABLE COLONIC MUCOSA.   C. LEFT COLON ULCERATED MUCOSA; COLD BIOPSY:  - ONE 2 MM FRAGMENT OF FLATTENED MUCOSA WITH A CRYPT ABSCESS.  - DETACHED FRAGMENT OF ACUTE INFLAMMATORY EXUDATE.  - FRAGMENTS OF UNREMARKABLE COLONIC MUCOSA.  Colonoscopy 08/21/2020 - The examined portion of the ileum was normal. - The entire examined colon is normal. - No specimens collected.  Past Medical History:  Diagnosis Date   Acute renal failure ([place])    due to bladder damage during hysterectomy   Insomnia    Tobacco use 05/02/2018   Wears contact lenses    sometimes    Past Surgical History:  Procedure Laterality Date   ABDOMINAL HYSTERECTOMY  2013   LAVH   BLADDER REPAIR     damaged during hysterectomy   CERVICAL BIOPSY  W/ LOOP ELECTRODE EXCISION  07/2008   CESAREAN SECTION     COLONOSCOPY WITH PROPOFOL N/A 12/20/2016   Procedure: COLONOSCOPY WITH PROPOFOL;  Surgeon: [name], MD;  Location: [place];  Service: Endoscopy;  Laterality: N/A;   COLONOSCOPY WITH PROPOFOL N/A 08/21/2020   Procedure: COLONOSCOPY WITH PROPOFOL;  Surgeon: [name], MD;  Location: [place];  Service: Endoscopy;  Laterality: N/A;   COLPOSCOPY  2009   ESOPHAGOGASTRODUODENOSCOPY N/A 12/21/2019   Procedure: ESOPHAGOGASTRODUODENOSCOPY (EGD);  Surgeon: [name], MD;  Location: [facility] ENDOSCOPY;  Service: Gastroenterology;  Laterality: N/A;   FLEXIBLE SIGMOIDOSCOPY N/A 12/21/2019   Procedure: FLEXIBLE SIGMOIDOSCOPY;  Surgeon: [name], MD;  Location: [facility] ENDOSCOPY;  Service: Gastroenterology;  Laterality: N/A;   KNEE SURGERY Right    age 43     Current Outpatient Medications:    acetaminophen (TYLENOL) 325 MG tablet, Take 650 mg by mouth every 6 (six) hours as needed., Disp: , Rfl:    aspirin EC 81 MG tablet, Take 81 mg by mouth daily. Swallow whole., Disp: , Rfl:    buPROPion (WELLBUTRIN XL) 150 MG 24 hr tablet,  Take 1 tablet by mouth daily., Disp: , Rfl:    clonazePAM (KLONOPIN) 0.5 MG tablet, Take by mouth., Disp: , Rfl:    Multiple Vitamin (MULTIVITAMIN WITH MINERALS) TABS tablet, Take 1 tablet by mouth daily., Disp: , Rfl:    ondansetron (ZOFRAN) 4 MG tablet, Take 1 tablet (4 mg total) by mouth every 8 (eight) hours as needed for nausea or vomiting., Disp: 14 tablet, Rfl: 1   pantoprazole (PROTONIX) 40 MG tablet, Take 1 tablet (40 mg total) by mouth 2 (two) times daily before a meal., Disp: 60 tablet, Rfl: 0   traZODone (DESYREL) 100 MG tablet, TAKE 1 TABLET BY MOUTH EVERYDAY AT BEDTIME (Patient taking differently: Take 100 mg by mouth at bedtime.), Disp: 90 tablet, Rfl: 2   Family History  Problem Relation Age of Onset   Alzheimer's disease Mother    Cancer Father 105       prostate   Heart disease Father    Breast cancer Cousin        mat cousin     Social History   Tobacco Use   Smoking status: Former    Packs/day: 0.50    Types: Cigarettes    Quit date: 12/18/2019    Years since quitting: 1.9   Smokeless tobacco: Never  Vaping Use   Vaping Use: Never used  Substance Use Topics   Alcohol use: Yes    Alcohol/week: 0.0 standard drinks   Drug use: No    Allergies as of 11/19/2021   (No Known Allergies)    Review of Systems:    All systems reviewed and negative except where noted in HPI.   Physical Exam:  BP 120/78 (BP Location: Left Arm, Patient Position: Sitting, Cuff Size: Normal)    Pulse 83    Temp (!) 97.5 F (36.4 C) (Oral)    Ht 5' 8"  (1.727 m)    Wt 133 lb 6 oz (60.5 kg)    BMI 20.28 kg/m  No LMP recorded. Patient has had a hysterectomy.  General:   Alert,  Well-developed, well-nourished, pleasant and cooperative in NAD Head:  Normocephalic and atraumatic. Eyes:  Sclera clear, no icterus.   Conjunctiva pink. Ears:  Normal auditory acuity. Nose:  No deformity, discharge, or lesions. Mouth:  No deformity or lesions,oropharynx pink & moist. Neck:  Supple; no  masses or thyromegaly. Lungs:  Respirations even and unlabored.  Clear throughout to auscultation.   No wheezes, crackles, or rhonchi. No acute distress. Heart:  Regular rate and rhythm; no murmurs, clicks, rubs, or gallops. Abdomen:  Normal bowel sounds. Soft, non-tender and non-distended without masses, hepatosplenomegaly or hernias noted.  No guarding or rebound tenderness.   Rectal: Not performed Msk:  Symmetrical without gross deformities. Good, equal movement & strength bilaterally. Pulses:  Normal pulses noted. Extremities:  No clubbing or edema.  No cyanosis. Neurologic:  Alert and oriented x3;  grossly normal neurologically. Skin:  Intact without significant lesions or rashes. No jaundice. Psych:  Alert and cooperative. Normal mood and affect.  Imaging Studies: Reviewed  Assessment and Plan:   REGENA DELUCCHI is a 66 y.o. female with history of chronic tobacco use, chronic constipation, left-sided colitis in 12/2019, flexible sigmoidoscopy confirmed severe left-sided colitis, pathology revealed severe acute colitis only.  She also had superficial duodenal ulcers.  CT abdomen and pelvis with contrast revealed aortic atherosclerosis, no evidence of focal narrowing or stenosis of mesenteric vasculature.  Patient has recurrence of lower abdominal discomfort associated with nausea  Lower abdominal discomfort associated with abdominal bloating and nausea Check pancreatic fecal elastase Continue MiraLAX Try CREON, 1 capsule with each meal, samples provided  Elevated alkaline phosphatase Recheck LFTs, GGT  Left-sided colitis, most likely ischemic colitis secondary to dehydration, tobacco use Recommend colonoscopy with TI evaluation to confirm healing and repeat biopsies to rule out inflammatory bowel disease I will hold off on CT angio at this time as CT abdomen pelvis with contrast revealed abdominal atherosclerosis only Continue to avoid smoking  Chronic constipation Continue  high-fiber diet, MiraLAX daily Adequate intake of water  Duodenal ulcers, likely secondary to ischemia No evidence of H. pylori Continue Protonix 40 mg 1-2 times daily Normal serum gastrin levels   Follow up as needed   [name], MD

## 2021-11-19 NOTE — Patient Instructions (Signed)
Gave samples of creon take 1 capsule with the first bite of each meal and 1 capsule with the first bite of each snack

## 2021-11-20 ENCOUNTER — Telehealth: Payer: Self-pay

## 2021-11-20 DIAGNOSIS — R748 Abnormal levels of other serum enzymes: Secondary | ICD-10-CM

## 2021-11-20 LAB — HEPATIC FUNCTION PANEL
ALT: 15 IU/L (ref 0–32)
AST: 30 IU/L (ref 0–40)
Albumin: 5 g/dL — ABNORMAL HIGH (ref 3.8–4.8)
Alkaline Phosphatase: 188 IU/L — ABNORMAL HIGH (ref 44–121)
Bilirubin Total: 0.2 mg/dL (ref 0.0–1.2)
Bilirubin, Direct: <0.1 mg/dL (ref 0.00–0.40)
Total Protein: 7.3 g/dL (ref 6.0–8.5)

## 2021-11-20 LAB — GAMMA GT: GGT: 49 IU/L (ref 0–60)

## 2021-11-20 NOTE — Telephone Encounter (Signed)
Order the lab and patient verbalized understanding

## 2021-11-20 NOTE — Telephone Encounter (Signed)
-----   Message from [name], MD sent at 11/20/2021 10:38 AM EST ----- Elevated alkaline phosphatase, let's check alkaline phosphatase isoenzymes  RV

## 2021-11-23 ENCOUNTER — Other Ambulatory Visit: Payer: Self-pay | Admitting: Gastroenterology

## 2021-11-23 ENCOUNTER — Other Ambulatory Visit: Payer: Self-pay

## 2021-11-23 ENCOUNTER — Ambulatory Visit: Admission: RE | Admit: 2021-11-23 | Payer: Medicare HMO | Source: Ambulatory Visit

## 2021-11-23 ENCOUNTER — Ambulatory Visit
Admission: RE | Admit: 2021-11-23 | Discharge: 2021-11-23 | Disposition: A | Discharge status: Home / Self Care | Payer: Medicare HMO | Source: Ambulatory Visit | Attending: Gastroenterology | Admitting: Gastroenterology

## 2021-11-23 ENCOUNTER — Encounter: Payer: Self-pay | Admitting: Gastroenterology

## 2021-11-23 DIAGNOSIS — R109 Unspecified abdominal pain: Secondary | ICD-10-CM

## 2021-11-23 NOTE — Addendum Note (Signed)
Addended by: Ulyess Blossom L on: 11/23/2021 10:59 AM   Modules accepted: Orders

## 2021-11-24 ENCOUNTER — Telehealth: Payer: Self-pay | Admitting: Gastroenterology

## 2021-11-24 ENCOUNTER — Ambulatory Visit: Payer: Medicare HMO

## 2021-11-24 DIAGNOSIS — C787 Secondary malignant neoplasm of liver and intrahepatic bile duct: Secondary | ICD-10-CM

## 2021-11-24 NOTE — Telephone Encounter (Signed)
Called patient to discuss about the CT scan results concerning for metastasis in the liver.  She reviewed the results with her PCP, Dr. Baldemar Lenis.  Plan is to proceed with mammogram as well as chest x-ray.  I recommend to undergo MRI abdomen and pelvis with contrast and MRCP to further characterize the liver lesions and look for any primary lesion.  Patient agreed to proceed with MRCP  Caryl Pina  Please schedule MRI abdomen and pelvis, MRCP Dx: Liver metastasis  [name], MD [facility], [place]  [address]  [place], [place] [address]  Main: [phone]  Fax: [phone] Pager: [phone]

## 2021-11-25 NOTE — Addendum Note (Signed)
Addended by: Ulyess Blossom L on: 11/25/2021 08:43 AM   Modules accepted: Orders

## 2021-11-25 NOTE — Telephone Encounter (Signed)
MRI department states they only need to do the MRCP abdominal and then do MR pelvis.   Got patient patient schedule for 12/04/2021 arrive at 4:30pm. Nothing to eat or drink 4 hours prior to scan. Called patient and informed patient of the scan and she verbalized understanding

## 2021-11-28 ENCOUNTER — Encounter: Payer: Self-pay | Admitting: Gastroenterology

## 2021-11-28 DIAGNOSIS — K269 Duodenal ulcer, unspecified as acute or chronic, without hemorrhage or perforation: Secondary | ICD-10-CM

## 2021-11-28 LAB — ALKALINE PHOSPHATASE, ISOENZYMES
Alkaline Phosphatase: 204 IU/L — ABNORMAL HIGH (ref 44–121)
BONE FRACTION: 29 % (ref 14–68)
INTESTINAL FRAC.: 0 % (ref 0–18)
LIVER FRACTION: 71 % (ref 18–85)

## 2021-11-30 ENCOUNTER — Other Ambulatory Visit: Payer: Self-pay | Admitting: Family Medicine

## 2021-11-30 DIAGNOSIS — Z1231 Encounter for screening mammogram for malignant neoplasm of breast: Secondary | ICD-10-CM

## 2021-11-30 NOTE — Telephone Encounter (Signed)
The MRI is still pending for approval from insurance  The below referenced case number is now pending [employer] review. Patient Name: Aimee Stuart Name: Aimee Stuart Member ID: [id number] Case Number: [id number]

## 2021-12-01 ENCOUNTER — Other Ambulatory Visit: Payer: Self-pay

## 2021-12-01 DIAGNOSIS — R109 Unspecified abdominal pain: Secondary | ICD-10-CM

## 2021-12-01 DIAGNOSIS — K269 Duodenal ulcer, unspecified as acute or chronic, without hemorrhage or perforation: Secondary | ICD-10-CM

## 2021-12-01 LAB — SPECIMEN STATUS REPORT

## 2021-12-01 LAB — PANCREATIC ELASTASE, FECAL: Pancreatic Elastase, Fecal: 196 ug Elast./g — ABNORMAL LOW (ref >200)

## 2021-12-02 ENCOUNTER — Ambulatory Visit: Payer: Medicare HMO | Admitting: Anesthesiology

## 2021-12-02 ENCOUNTER — Ambulatory Visit
Admission: RE | Admit: 2021-12-02 | Discharge: 2021-12-02 | Disposition: A | Discharge status: Home / Self Care | Payer: Medicare HMO | Attending: Gastroenterology | Admitting: Gastroenterology

## 2021-12-02 ENCOUNTER — Encounter
Admission: RE | Disposition: A | Discharge status: Home / Self Care | Payer: Self-pay | Source: Home / Self Care | Attending: Gastroenterology

## 2021-12-02 DIAGNOSIS — K449 Diaphragmatic hernia without obstruction or gangrene: Secondary | ICD-10-CM | POA: Diagnosis not present

## 2021-12-02 DIAGNOSIS — K319 Disease of stomach and duodenum, unspecified: Secondary | ICD-10-CM

## 2021-12-02 DIAGNOSIS — R109 Unspecified abdominal pain: Secondary | ICD-10-CM

## 2021-12-02 DIAGNOSIS — F419 Anxiety disorder, unspecified: Secondary | ICD-10-CM | POA: Diagnosis not present

## 2021-12-02 DIAGNOSIS — R1030 Lower abdominal pain, unspecified: Secondary | ICD-10-CM | POA: Insufficient documentation

## 2021-12-02 DIAGNOSIS — K269 Duodenal ulcer, unspecified as acute or chronic, without hemorrhage or perforation: Secondary | ICD-10-CM | POA: Diagnosis not present

## 2021-12-02 DIAGNOSIS — K2970 Gastritis, unspecified, without bleeding: Secondary | ICD-10-CM | POA: Diagnosis not present

## 2021-12-02 DIAGNOSIS — F32A Depression, unspecified: Secondary | ICD-10-CM | POA: Diagnosis not present

## 2021-12-02 DIAGNOSIS — K3189 Other diseases of stomach and duodenum: Secondary | ICD-10-CM | POA: Insufficient documentation

## 2021-12-02 DIAGNOSIS — N289 Disorder of kidney and ureter, unspecified: Secondary | ICD-10-CM | POA: Diagnosis not present

## 2021-12-02 DIAGNOSIS — R140 Abdominal distension (gaseous): Secondary | ICD-10-CM | POA: Insufficient documentation

## 2021-12-02 DIAGNOSIS — Z87891 Personal history of nicotine dependence: Secondary | ICD-10-CM | POA: Diagnosis not present

## 2021-12-02 HISTORY — PX: ESOPHAGOGASTRODUODENOSCOPY (EGD) WITH PROPOFOL: SHX5813

## 2021-12-02 SURGERY — ESOPHAGOGASTRODUODENOSCOPY (EGD) WITH PROPOFOL
Anesthesia: General | Laterality: Left

## 2021-12-02 MED ORDER — PROPOFOL 500 MG/50ML IV EMUL
INTRAVENOUS | Status: AC
  Filled 2021-12-02: qty 50

## 2021-12-02 MED ORDER — PHENYLEPHRINE HCL-NACL 20-0.9 MG/250ML-% IV SOLN
INTRAVENOUS | Status: AC
  Filled 2021-12-02: qty 250

## 2021-12-02 MED ORDER — FENTANYL CITRATE (PF) 100 MCG/2ML IJ SOLN
INTRAMUSCULAR | Status: AC
  Filled 2021-12-02: qty 2

## 2021-12-02 MED ORDER — SODIUM CHLORIDE 0.9 % IV SOLN
INTRAVENOUS | Status: DC
  Administered 2021-12-02: 20 mL/h via INTRAVENOUS

## 2021-12-02 MED ORDER — PROPOFOL 500 MG/50ML IV EMUL
INTRAVENOUS | Status: DC | PRN
  Administered 2021-12-02: 100 ug/kg/min via INTRAVENOUS

## 2021-12-02 MED ORDER — FENTANYL CITRATE (PF) 100 MCG/2ML IJ SOLN
INTRAMUSCULAR | Status: DC | PRN
  Administered 2021-12-02: 25 ug via INTRAVENOUS
  Administered 2021-12-02: 50 ug via INTRAVENOUS

## 2021-12-02 NOTE — Anesthesia Postprocedure Evaluation (Signed)
Anesthesia Post Note ? ?Patient: Aimee Stuart ? ?Procedure(s) Performed: ESOPHAGOGASTRODUODENOSCOPY (EGD) WITH PROPOFOL (Left) ? ?Patient location during evaluation: PACU ?Anesthesia Type: General ?Level of consciousness: awake and oriented ?Pain management: pain level controlled ?Vital Signs Assessment: post-procedure vital signs reviewed and stable ?Respiratory status: spontaneous breathing and nonlabored ventilation ?Cardiovascular status: stable ?Anesthetic complications: no ? ? ?No notable events documented. ? ? ?Last Vitals:  ?Vitals:  ? 12/02/21 0840 12/02/21 0850  ?BP: 138/81 (!) 153/90  ?Pulse: 72 75  ?Resp: 15 17  ?Temp:    ?SpO2: 97% 98%  ?  ?Last Pain:  ?Vitals:  ? 12/02/21 0820  ?TempSrc: Temporal  ?PainSc:   ? ? ?  ?  ?  ?  ?  ?  ? ?VAN STAVEREN,Zacharey Jensen ? ? ? ? ?

## 2021-12-02 NOTE — Anesthesia Preprocedure Evaluation (Signed)
Anesthesia Evaluation  ?Patient identified by MRN, date of birth, ID band ?Patient awake ? ? ? ?Reviewed: ?Allergy & Precautions, NPO status , Patient's Chart, lab work & pertinent test results ? ?Airway ?Mallampati: II ? ?TM Distance: >3 FB ?Neck ROM: full ? ? ? Dental ? ?(+) Teeth Intact ?  ?Pulmonary ?neg pulmonary ROS, former smoker,  ?  ?Pulmonary exam normal ? ?+ decreased breath sounds ? ? ? ? ? Cardiovascular ?Exercise Tolerance: Poor ?negative cardio ROS ?Normal cardiovascular exam ?Rhythm:Regular Rate:Normal ? ? ?  ?Neuro/Psych ?Anxiety Depression negative neurological ROS ? negative psych ROS  ? GI/Hepatic ?negative GI ROS, Neg liver ROS,   ?Endo/Other  ?negative endocrine ROS ? Renal/GU ?Renal InsufficiencyRenal diseasenegative Renal ROS  ?negative genitourinary ?  ?Musculoskeletal ?negative musculoskeletal ROS ?(+)  ? Abdominal ?Normal abdominal exam  (+)   ?Peds ?negative pediatric ROS ?(+)  Hematology ?negative hematology ROS ?(+)   ?Anesthesia Other Findings ?Past Medical History: ?No date: Acute renal failure ([place]) ?    Comment:  due to bladder damage during hysterectomy ?No date: Insomnia ?05/02/2018: Tobacco use ?No date: Wears contact lenses ?    Comment:  sometimes ? ?Past Surgical History: ?2013: ABDOMINAL HYSTERECTOMY ?    Comment:  LAVH ?No date: BLADDER REPAIR ?    Comment:  damaged during hysterectomy ?07/2008: CERVICAL BIOPSY  W/ LOOP ELECTRODE EXCISION ?No date: CESAREAN SECTION ?12/20/2016: COLONOSCOPY WITH PROPOFOL; N/A ?    Comment:  Procedure: COLONOSCOPY WITH PROPOFOL;  Surgeon: Evangeline Gula  ?             Allen Norris, MD;  Location: [place];  Service:  ?             Endoscopy;  Laterality: N/A; ?08/21/2020: COLONOSCOPY WITH PROPOFOL; N/A ?    Comment:  Procedure: COLONOSCOPY WITH PROPOFOL;  Surgeon: [name],  ?             [name], MD;  Location: [place];   ?             Service: Endoscopy;  Laterality: N/A; ?2009: COLPOSCOPY ?12/21/2019:  ESOPHAGOGASTRODUODENOSCOPY; N/A ?    Comment:  Procedure: ESOPHAGOGASTRODUODENOSCOPY (EGD);  Surgeon:  ?             [name], MD;  Location: [facility] ENDOSCOPY;   ?             Service: Gastroenterology;  Laterality: N/A; ?12/21/2019: FLEXIBLE SIGMOIDOSCOPY; N/A ?    Comment:  Procedure: FLEXIBLE SIGMOIDOSCOPY;  Surgeon: [name],  ?             [name], MD;  Location: [facility] ENDOSCOPY;  Service:  ?             Gastroenterology;  Laterality: N/A; ?No date: KNEE SURGERY; Right ?    Comment:  age 66 ? ?BMI   ? Body Mass Index: 19.46 kg/m?  ?  ? ? Reproductive/Obstetrics ?negative OB ROS ? ?  ? ? ? ? ? ? ? ? ? ? ? ? ? ?  ?  ? ? ? ? ? ? ? ? ?Anesthesia Physical ?Anesthesia Plan ? ?ASA: 3 ? ?Anesthesia Plan: General  ? ?Post-op Pain Management:   ? ?Induction: Intravenous ? ?PONV Risk Score and Plan: Propofol infusion and TIVA ? ?Airway Management Planned: Natural Airway and Nasal Cannula ? ?Additional Equipment:  ? ?Intra-op Plan:  ? ?Post-operative Plan:  ? ?Informed Consent: I have reviewed the patients History and Physical, chart, labs and  discussed the procedure including the risks, benefits and alternatives for the proposed anesthesia with the patient or authorized representative who has indicated his/her understanding and acceptance.  ? ? ? ?Dental Advisory Given ? ?Plan Discussed with: CRNA and Surgeon ? ?Anesthesia Plan Comments:   ? ? ? ? ? ? ?Anesthesia Quick Evaluation ? ?

## 2021-12-02 NOTE — Transfer of Care (Signed)
Immediate Anesthesia Transfer of Care Note ? ?Patient: Aimee Stuart ? ?Procedure(s) Performed: ESOPHAGOGASTRODUODENOSCOPY (EGD) WITH PROPOFOL (Left) ? ?Patient Location: PACU and Endoscopy Unit ? ?Anesthesia Type:MAC ? ?Level of Consciousness: sedated ? ?Airway & Oxygen Therapy: Patient Spontanous Breathing ? ?Post-op Assessment: Report given to RN ? ?Post vital signs: Reviewed and stable ? ?Last Vitals:  ?Vitals Value Taken Time  ?BP 135/79 12/02/21 0829  ?Temp    ?Pulse 71 12/02/21 0830  ?Resp 13 12/02/21 0830  ?SpO2 100 % 12/02/21 0830  ?Vitals shown include unvalidated device data. ? ?Last Pain:  ?Vitals:  ? 12/02/21 0820  ?TempSrc: Temporal  ?PainSc:   ?   ? ?  ? ?Complications: No notable events documented. ?

## 2021-12-02 NOTE — H&P (Signed)
?[name], MD ?[address]  ?[address]  ?[place], [place] [address]  ?Main: [phone]  ?Fax: [phone] ?Pager: [phone] ? ?Primary Care Physician:  [name], MD ?Primary Gastroenterologist:  Dr. [name] ? ?Pre-Procedure History & Physical: ?HPI:  Aimee Stuart is a 66 y.o. female is here for an endoscopy. ?  ?Past Medical History:  ?Diagnosis Date  ? Acute renal failure ([place])   ? due to bladder damage during hysterectomy  ? Insomnia   ? Tobacco use 05/02/2018  ? Wears contact lenses   ? sometimes  ? ? ?Past Surgical History:  ?Procedure Laterality Date  ? ABDOMINAL HYSTERECTOMY  2013  ? LAVH  ? BLADDER REPAIR    ? damaged during hysterectomy  ? CERVICAL BIOPSY  W/ LOOP ELECTRODE EXCISION  07/2008  ? CESAREAN SECTION    ? COLONOSCOPY WITH PROPOFOL N/A 12/20/2016  ? Procedure: COLONOSCOPY WITH PROPOFOL;  Surgeon: [name], MD;  Location: [facility];  Service: Endoscopy;  Laterality: N/A;  ? COLONOSCOPY WITH PROPOFOL N/A 08/21/2020  ? Procedure: COLONOSCOPY WITH PROPOFOL;  Surgeon: [name], MD;  Location: [facility];  Service: Endoscopy;  Laterality: N/A;  ? COLPOSCOPY  2009  ? ESOPHAGOGASTRODUODENOSCOPY N/A 12/21/2019  ? Procedure: ESOPHAGOGASTRODUODENOSCOPY (EGD);  Surgeon: [name], MD;  Location: [facility] ENDOSCOPY;  Service: Gastroenterology;  Laterality: N/A;  ? FLEXIBLE SIGMOIDOSCOPY N/A 12/21/2019  ? Procedure: FLEXIBLE SIGMOIDOSCOPY;  Surgeon: [name], MD;  Location: [facility] ENDOSCOPY;  Service: Gastroenterology;  Laterality: N/A;  ? KNEE SURGERY Right   ? age 47  ? ? ?Prior to Admission medications   ?Medication Sig Start Date End Date Taking? Authorizing Provider  ?acetaminophen (TYLENOL) 325 MG tablet Take 650 mg by mouth every 6 (six) hours as needed.    [provider]  ?aspirin EC 81 MG tablet Take 81 mg by mouth daily. Swallow whole.    [provider]  ?buPROPion (WELLBUTRIN XL) 150 MG 24 hr tablet Take  1 tablet by mouth daily. 07/31/21   [provider]  ?clonazePAM Bobbye [place]) 0.5 MG tablet Take by mouth. 08/14/20   [provider]  ?Multiple Vitamin (MULTIVITAMIN WITH MINERALS) TABS tablet Take 1 tablet by mouth daily.    [provider]  ?ondansetron (ZOFRAN) 4 MG tablet Take 1 tablet (4 mg total) by mouth every 8 (eight) hours as needed for nausea or vomiting. 11/17/21   [name], [name], MD  ?pantoprazole (PROTONIX) 40 MG tablet Take 1 tablet (40 mg total) by mouth 2 (two) times daily before a meal. 12/24/19   [name], MD  ?traZODone (DESYREL) 100 MG tablet TAKE 1 TABLET BY MOUTH EVERYDAY AT BEDTIME ?Patient taking differently: Take 100 mg by mouth at bedtime. 07/01/18   [name], MD  ? ? ?Allergies as of 12/01/2021  ? (No Known Allergies)  ? ? ?Family History  ?Problem Relation Age of Onset  ? Alzheimer's disease Mother   ? Cancer Father 51  ?     prostate  ? Heart disease Father   ? Breast cancer Cousin   ?     mat cousin  ? ? ?Social History  ? ?Socioeconomic History  ? Marital status: Married  ?  Spouse name: Not on file  ? Number of children: Not on file  ? Years of education: Not on file  ? Highest education level: Not on file  ?Occupational History  ? Not on file  ?Tobacco Use  ? Smoking  status: Former  ?  Packs/day: 0.50  ?  Types: Cigarettes  ?  Quit date: 12/18/2019  ?  Years since quitting: 1.9  ? Smokeless tobacco: Never  ?Vaping Use  ? Vaping Use: Never used  ?Substance and Sexual Activity  ? Alcohol use: Yes  ?  Alcohol/week: 0.0 standard drinks  ? Drug use: No  ? Sexual activity: Never  ?Other Topics Concern  ? Not on file  ?Social History Narrative  ? Not on file  ? ?Social Determinants of Health  ? ?Financial Resource Strain: Not on file  ?Food Insecurity: Not on file  ?Transportation Needs: Not on file  ?Physical Activity: Not on file  ?Stress: Not on file  ?Social Connections: Not on file  ?Intimate Partner Violence: Not on file  ? ? ?Review of  Systems: ?See HPI, otherwise negative ROS ? ?Physical Exam: ?BP (!) 139/103   Temp 97.7 ?F (36.5 ?C) (Temporal)   Resp 20   Ht 5' 8"  (1.727 m)   Wt 58.1 kg   SpO2 99%   BMI 19.46 kg/m?  ?General:   Alert,  pleasant and cooperative in NAD ?Head:  Normocephalic and atraumatic. ?Neck:  Supple; no masses or thyromegaly. ?Lungs:  Clear throughout to auscultation.    ?Heart:  Regular rate and rhythm. ?Abdomen:  Soft, nontender and nondistended. Normal bowel sounds, without guarding, and without rebound.   ?Neurologic:  Alert and  oriented x4;  grossly normal neurologically. ? ?Impression/Plan: ?Aimee Stuart is here for an endoscopy to be performed for abdominal pain, bloating ? ?Risks, benefits, limitations, and alternatives regarding  endoscopy have been reviewed with the patient.  Questions have been answered.  All parties agreeable. ? ? ?[name], MD  12/02/2021, 8:03 AM ?

## 2021-12-02 NOTE — Op Note (Signed)
[facility] ?Gastroenterology ?Patient Name: Aimee Stuart ?Procedure Date: 12/02/2021 8:10 AM ?MRN: [id number] ?Account #: [id number] ?Date of Birth: 1956-10-03 ?Admit Type: Outpatient ?Age: 66 ?Room: [facility] ENDO ROOM 4 ?Gender: Female ?Note Status: Finalized ?Instrument Name: Upper Endoscope [id number] ?Procedure:             Upper GI endoscopy ?Indications:           Lower abdominal pain, Abdominal bloating ?Providers:             [name] MD, MD ?Referring MD:          Caprice Renshaw MD (Referring MD) ?Medicines:             General Anesthesia ?Complications:         No immediate complications. Estimated blood loss: None. ?Procedure:             Pre-Anesthesia Assessment: ?                       - Prior to the procedure, a History and Physical was  ?                       performed, and patient medications and allergies were  ?                       reviewed. The patient is competent. The risks and  ?                       benefits of the procedure and the sedation options and  ?                       risks were discussed with the patient. All questions  ?                       were answered and informed consent was obtained.  ?                       Patient identification and proposed procedure were  ?                       verified by the physician, the nurse, the  ?                       anesthesiologist, the anesthetist and the technician  ?                       in the pre-procedure area in the procedure room in the  ?                       endoscopy suite. Mental Status Examination: alert and  ?                       oriented. Airway Examination: normal oropharyngeal  ?                       airway and neck mobility. Respiratory Examination:  ?                       clear to auscultation. CV Examination: normal.  ?  Prophylactic Antibiotics: The patient does not require  ?                       prophylactic antibiotics. Prior Anticoagulants: The  ?                        patient has taken no previous anticoagulant or  ?                       antiplatelet agents. ASA Grade Assessment: II - A  ?                       patient with mild systemic disease. After reviewing  ?                       the risks and benefits, the patient was deemed in  ?                       satisfactory condition to undergo the procedure. The  ?                       anesthesia plan was to use general anesthesia.  ?                       Immediately prior to administration of medications,  ?                       the patient was re-assessed for adequacy to receive  ?                       sedatives. The heart rate, respiratory rate, oxygen  ?                       saturations, blood pressure, adequacy of pulmonary  ?                       ventilation, and response to care were monitored  ?                       throughout the procedure. The physical status of the  ?                       patient was re-assessed after the procedure. ?                       After obtaining informed consent, the endoscope was  ?                       passed under direct vision. Throughout the procedure,  ?                       the patient's blood pressure, pulse, and oxygen  ?                       saturations were monitored continuously. The Endoscope  ?                       was introduced through the mouth, and advanced to the  ?  second part of duodenum. The upper GI endoscopy was  ?                       accomplished without difficulty. The patient tolerated  ?                       the procedure well. ?Findings: ?     The duodenal bulb and second portion of the duodenum were normal. ?     Striped mildly erythematous mucosa without bleeding was found in the  ?     entire examined stomach. Biopsies were taken with a cold forceps for  ?     histology. ?     The cardia and gastric fundus were normal on retroflexion. ?     A small hiatal hernia was present. ?     Esophagogastric landmarks were  identified: the gastroesophageal junction  ?     was found at 40 cm from the incisors. ?     The gastroesophageal junction and examined esophagus were normal. ?Impression:            - Normal duodenal bulb and second portion of the  ?                       duodenum. ?                       - Erythematous mucosa in the stomach. Biopsied. ?                       - Small hiatal hernia. ?                       - Esophagogastric landmarks identified. ?                       - Normal gastroesophageal junction and esophagus. ?Recommendation:        - Await pathology results. ?                       - Discharge patient to home (with escort). ?                       - Resume previous diet today. ?                       - Return to my office as previously scheduled. ?Procedure Code(s):     --- Professional --- ?                       [id number], Esophagogastroduodenoscopy, flexible,  ?                       transoral; with biopsy, single or multiple ?Diagnosis Code(s):     --- Professional --- ?                       K31.89, Other diseases of stomach and duodenum ?                       K44.9, Diaphragmatic hernia without obstruction or  ?  gangrene ?                       R10.30, Lower abdominal pain, unspecified ?                       R14.0, Abdominal distension (gaseous) ?CPT copyright 2019 [employer]. All rights reserved. ?The codes documented in this report are preliminary and upon coder review may  ?be revised to meet current compliance requirements. ?Dr. Ulyess Mort ?[name] MD, MD ?12/02/2021 8:25:27 AM ?This report has been signed electronically. ?Number of Addenda: 0 ?Note Initiated On: 12/02/2021 8:10 AM ?Estimated Blood Loss:  Estimated blood loss: none. ?     [facility] ?

## 2021-12-03 ENCOUNTER — Encounter: Payer: Self-pay | Admitting: Gastroenterology

## 2021-12-03 LAB — SURGICAL PATHOLOGY

## 2021-12-04 ENCOUNTER — Ambulatory Visit
Admission: RE | Admit: 2021-12-04 | Discharge: 2021-12-04 | Disposition: A | Discharge status: Home / Self Care | Payer: Medicare HMO | Source: Ambulatory Visit | Attending: Gastroenterology | Admitting: Gastroenterology

## 2021-12-04 ENCOUNTER — Telehealth: Payer: Self-pay

## 2021-12-04 DIAGNOSIS — C787 Secondary malignant neoplasm of liver and intrahepatic bile duct: Secondary | ICD-10-CM

## 2021-12-04 MED ORDER — GADOBUTROL 1 MMOL/ML IV SOLN
6.0000 mL | Freq: Once | INTRAVENOUS | Status: AC | PRN
  Administered 2021-12-04: 6 mL via INTRAVENOUS

## 2021-12-04 NOTE — Telephone Encounter (Signed)
Called patient to discuss her NP appt on Monday with Dr. Janese Banks for Malignant neoplasm of liver and intraheptic bile duct. Patient did not answer. Left detailed VM advising patient to call back if she has any questions or concerns. ?

## 2021-12-07 ENCOUNTER — Encounter: Payer: Self-pay | Admitting: Oncology

## 2021-12-07 ENCOUNTER — Inpatient Hospital Stay: Payer: Medicare HMO | Attending: Oncology | Admitting: Oncology

## 2021-12-07 ENCOUNTER — Telehealth: Payer: Self-pay | Admitting: Gastroenterology

## 2021-12-07 ENCOUNTER — Other Ambulatory Visit: Payer: Self-pay

## 2021-12-07 ENCOUNTER — Telehealth: Payer: Self-pay

## 2021-12-07 ENCOUNTER — Inpatient Hospital Stay: Payer: Medicare HMO

## 2021-12-07 VITALS — BP 131/93 | HR 85 | Temp 96.5°F | Resp 18 | Wt 126.4 lb

## 2021-12-07 DIAGNOSIS — K8689 Other specified diseases of pancreas: Secondary | ICD-10-CM | POA: Insufficient documentation

## 2021-12-07 DIAGNOSIS — Z79899 Other long term (current) drug therapy: Secondary | ICD-10-CM | POA: Diagnosis not present

## 2021-12-07 DIAGNOSIS — Z803 Family history of malignant neoplasm of breast: Secondary | ICD-10-CM | POA: Diagnosis not present

## 2021-12-07 DIAGNOSIS — Z8042 Family history of malignant neoplasm of prostate: Secondary | ICD-10-CM | POA: Insufficient documentation

## 2021-12-07 DIAGNOSIS — Z7189 Other specified counseling: Secondary | ICD-10-CM

## 2021-12-07 DIAGNOSIS — C787 Secondary malignant neoplasm of liver and intrahepatic bile duct: Secondary | ICD-10-CM | POA: Diagnosis present

## 2021-12-07 DIAGNOSIS — I700 Atherosclerosis of aorta: Secondary | ICD-10-CM | POA: Insufficient documentation

## 2021-12-07 DIAGNOSIS — G893 Neoplasm related pain (acute) (chronic): Secondary | ICD-10-CM | POA: Diagnosis not present

## 2021-12-07 DIAGNOSIS — R109 Unspecified abdominal pain: Secondary | ICD-10-CM | POA: Insufficient documentation

## 2021-12-07 DIAGNOSIS — R1013 Epigastric pain: Secondary | ICD-10-CM | POA: Diagnosis not present

## 2021-12-07 DIAGNOSIS — Z818 Family history of other mental and behavioral disorders: Secondary | ICD-10-CM | POA: Diagnosis not present

## 2021-12-07 DIAGNOSIS — C259 Malignant neoplasm of pancreas, unspecified: Secondary | ICD-10-CM

## 2021-12-07 DIAGNOSIS — R5383 Other fatigue: Secondary | ICD-10-CM | POA: Diagnosis not present

## 2021-12-07 DIAGNOSIS — C801 Malignant (primary) neoplasm, unspecified: Secondary | ICD-10-CM | POA: Diagnosis present

## 2021-12-07 DIAGNOSIS — Z8249 Family history of ischemic heart disease and other diseases of the circulatory system: Secondary | ICD-10-CM | POA: Insufficient documentation

## 2021-12-07 DIAGNOSIS — Z87891 Personal history of nicotine dependence: Secondary | ICD-10-CM | POA: Insufficient documentation

## 2021-12-07 DIAGNOSIS — R160 Hepatomegaly, not elsewhere classified: Secondary | ICD-10-CM

## 2021-12-07 LAB — COMPREHENSIVE METABOLIC PANEL WITH GFR
ALT: 30 U/L (ref 0–44)
AST: 40 U/L (ref 15–41)
Albumin: 4.1 g/dL (ref 3.5–5.0)
Alkaline Phosphatase: 202 U/L — ABNORMAL HIGH (ref 38–126)
Anion gap: 10 (ref 5–15)
BUN: 14 mg/dL (ref 8–23)
CO2: 28 mmol/L (ref 22–32)
Calcium: 9.6 mg/dL (ref 8.9–10.3)
Chloride: 96 mmol/L — ABNORMAL LOW (ref 98–111)
Creatinine, Ser: 0.81 mg/dL (ref 0.44–1.00)
GFR, Estimated: >60 mL/min (ref >60)
Glucose, Bld: 98 mg/dL (ref 70–99)
Potassium: 4.1 mmol/L (ref 3.5–5.1)
Sodium: 134 mmol/L — ABNORMAL LOW (ref 135–145)
Total Bilirubin: 0.3 mg/dL (ref 0.3–1.2)
Total Protein: 7.4 g/dL (ref 6.5–8.1)

## 2021-12-07 LAB — PROTIME-INR
INR: 1 (ref 0.8–1.2)
Prothrombin Time: 13.6 seconds (ref 11.4–15.2)

## 2021-12-07 MED ORDER — OXYCODONE HCL 5 MG PO TABS
5.0000 mg | ORAL_TABLET | ORAL | 0 refills | Status: AC | PRN
Start: 2021-12-07 — End: ?

## 2021-12-07 NOTE — Progress Notes (Signed)
Hematology/Oncology Consult note [facility] Telephone:([phone] [phone]  Patient Care Team: [name], MD as PCP - General (Family Medicine) Clent Jacks, RN as Oncology Nurse Navigator   Name of the patient: Aimee Stuart  [id number]  [birth year]    Reason for referral-pancreatic mass/liver metastases   Referring physician-Dr. Baldemar Lenis  Date of visit: 12/07/21   History of presenting illness- Patient is a 66 year old Caucasian female who has been following up with GI for chronic abdominal pain.  In March 2021 she was noted to have an episode of left-sided colitis and CT abdomen at that time did not reveal any abnormality in the pancreas or liver she then had a CT scan on 11/23/2021 which showed new low-density lesions in the liver suggestive of metastases. 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  No evidence of metastatic lymphadenopathy.  Central splenic vein replaced by the pancreatic mass with venous collateralization.  Large stool burden  Patient currently lives with her husband.  Her only daughter lives in [place].  Patient has been feeling more fatigued.  She has lost 7 pounds in the last 1 week.  Appetite is fair to poor.  She does report epigastric abdominal pain which is not currently well controlled with Tylenol.  ECOG PS- 1  Pain scale- 5   Review of systems- Review of Systems  Constitutional:  Positive for malaise/fatigue and weight loss. Negative for chills and fever.  HENT:  Negative for congestion, ear discharge and nosebleeds.   Eyes:  Negative for blurred vision.  Respiratory:  Negative for cough, hemoptysis, sputum production, shortness  of breath and wheezing.   Cardiovascular:  Negative for chest pain, palpitations, orthopnea and claudication.  Gastrointestinal:  Negative for abdominal pain, blood in stool, constipation, diarrhea, heartburn, melena, nausea and vomiting.  Genitourinary:  Negative for dysuria, flank pain, frequency, hematuria and urgency.  Musculoskeletal:  Negative for back pain, joint pain and myalgias.  Skin:  Negative for rash.  Neurological:  Negative for dizziness, tingling, focal weakness, seizures, weakness and headaches.  Endo/Heme/Allergies:  Does not bruise/bleed easily.  Psychiatric/Behavioral:  Negative for depression and suicidal ideas. The patient does not have insomnia.    No Known Allergies  Patient Active Problem List   Diagnosis Date Noted   Multiple duodenal ulcers    Left sided abdominal pain    Gastric erythema    Left sided colitis without complications ([place]) 16/94/5038   Elevated BP without diagnosis of hypertension    Hyperglycemia    Depression with anxiety    Anxiety 05/02/2018   Hx of colonic polyps      Past Medical History:  Diagnosis Date   Acute renal failure ([place])    due to bladder damage during hysterectomy   Insomnia    Tobacco use 05/02/2018   Wears contact lenses    sometimes     Past Surgical History:  Procedure Laterality Date   ABDOMINAL HYSTERECTOMY  2013   [facility]   BLADDER REPAIR     damaged during hysterectomy   CERVICAL BIOPSY  W/ LOOP ELECTRODE EXCISION  07/2008   CESAREAN SECTION     COLONOSCOPY WITH PROPOFOL N/A 12/20/2016   Procedure: COLONOSCOPY WITH PROPOFOL;  Surgeon:  [name], MD;  Location: [facility];  Service: Endoscopy;  Laterality: N/A;   COLONOSCOPY WITH PROPOFOL N/A 08/21/2020   Procedure: COLONOSCOPY WITH PROPOFOL;  Surgeon: [name], MD;  Location: [place];  Service: Endoscopy;  Laterality: N/A;   COLPOSCOPY  2009   ESOPHAGOGASTRODUODENOSCOPY N/A 12/21/2019   Procedure:  ESOPHAGOGASTRODUODENOSCOPY (EGD);  Surgeon: [name], MD;  Location[facility] ENDOSCOPY;  Service: Gastroenterology;  Laterality: N/A;   ESOPHAGOGASTRODUODENOSCOPY (EGD) WITH PROPOFOL Left 12/02/2021   Procedure: ESOPHAGOGASTRODUODENOSCOPY (EGD) WITH PROPOFOL;  Surgeon: [name], MD;  Location: [facility] ENDOSCOPY;  Service: Gastroenterology;  Laterality: Left;   FLEXIBLE SIGMOIDOSCOPY N/A 12/21/2019   Procedure: FLEXIBLE SIGMOIDOSCOPY;  Surgeon: [name], MD;  Location: [facility] ENDOSCOPY;  Service: Gastroenterology;  Laterality: N/A;   KNEE SURGERY Right    age 73    Social History   Socioeconomic History   Marital status: Married    Spouse name: Not on file   Number of children: Not on file   Years of education: Not on file   Highest education level: Not on file  Occupational History   Not on file  Tobacco Use   Smoking status: Former    Packs/day: 0.50    Types: Cigarettes    Quit date: 12/18/2019    Years since quitting: 1.9   Smokeless tobacco: Never  Vaping Use   Vaping Use: Never used  Substance and Sexual Activity   Alcohol use: Yes    Alcohol/week: 0.0 standard drinks   Drug use: No   Sexual activity: Never  Other Topics Concern   Not on file  Social History Narrative   Not on file   Social Determinants of Health   Financial Resource Strain: Not on file  Food Insecurity: Not on file  Transportation Needs: Not on file  Physical Activity: Not on file  Stress: Not on file  Social Connections: Not on file  Intimate Partner Violence: Not on file     Family History  Problem Relation Age of Onset   Alzheimer's disease Mother    Cancer Father 11       prostate   Heart disease Father    Breast cancer Cousin        mat cousin     Current Outpatient Medications:    acetaminophen (TYLENOL) 325 MG tablet, Take 650 mg by mouth every 6 (six) hours as needed., Disp: , Rfl:    aspirin EC 81 MG tablet, Take 81 mg by mouth daily. Swallow whole.,  Disp: , Rfl:    buPROPion (WELLBUTRIN XL) 150 MG 24 hr tablet, Take 1 tablet by mouth daily., Disp: , Rfl:    citalopram (CELEXA) 10 MG tablet, Take 1 tablet by mouth daily., Disp: , Rfl:    clonazePAM (KLONOPIN) 0.5 MG tablet, Take by mouth., Disp: , Rfl:    clonazePAM (KLONOPIN) 0.5 MG tablet, Take by mouth., Disp: , Rfl:    ondansetron (ZOFRAN) 4 MG tablet, Take 1 tablet (4 mg total) by mouth every 8 (eight) hours as needed for nausea or vomiting., Disp: 14 tablet, Rfl: 1   oxyCODONE (OXY IR/ROXICODONE) 5 MG immediate release tablet, Take 1 tablet (5 mg total) by mouth every 4 (four) hours as needed for severe pain., Disp: 120 tablet, Rfl: 0   pantoprazole (PROTONIX) 40 MG tablet, Take 1 tablet (40 mg total) by mouth 2 (two) times daily before a meal., Disp: 60 tablet, Rfl: 0   traZODone (DESYREL) 100 MG tablet, TAKE  1 TABLET BY MOUTH EVERYDAY AT BEDTIME (Patient taking differently: Take 100 mg by mouth at bedtime.), Disp: 90 tablet, Rfl: 2   Multiple Vitamin (MULTIVITAMIN WITH MINERALS) TABS tablet, Take 1 tablet by mouth daily. (Patient not taking: Reported on 12/07/2021), Disp: , Rfl:    Physical exam:  Vitals:   12/07/21 0928  BP: (!) 131/93  Pulse: 85  Resp: 18  Temp: (!) 96.5 F (35.8 C)  SpO2: 98%  Weight: 126 lb 6.4 oz (57.3 kg)   Physical Exam Constitutional:      General: She is not in acute distress. Cardiovascular:     Rate and Rhythm: Normal rate and regular rhythm.     Heart sounds: Normal heart sounds.  Pulmonary:     Effort: Pulmonary effort is normal.     Breath sounds: Normal breath sounds.  Abdominal:     General: Bowel sounds are normal.     Palpations: Abdomen is soft.     Comments: Ttp in the epigastrium  Skin:    General: Skin is warm and dry.  Neurological:     Mental Status: She is alert and oriented to person, place, and time.       CMP Latest Ref Rng & Units 12/07/2021  Glucose 70 - 99 mg/dL 98  BUN 8 - 23 mg/dL 14  Creatinine 0.44 - 1.00 mg/dL  0.81  Sodium 135 - 145 mmol/L 134(L)  Potassium 3.5 - 5.1 mmol/L 4.1  Chloride 98 - 111 mmol/L 96(L)  CO2 22 - 32 mmol/L 28  Calcium 8.9 - 10.3 mg/dL 9.6  Total Protein 6.5 - 8.1 g/dL 7.4  Total Bilirubin 0.3 - 1.2 mg/dL 0.3  Alkaline Phos 38 - 126 U/L 202(H)  AST 15 - 41 U/L 40  ALT 0 - 44 U/L 30   CBC Latest Ref Rng & Units 09/17/2020  WBC 3.4 - 10.8 x10E3/uL 5.9  Hemoglobin 11.1 - 15.9 g/dL 12.4  Hematocrit 34.0 - 46.6 % 37.2  Platelets 150 - 450 x10E3/uL 241    No images are attached to the encounter.  CT ABDOMEN WO CONTRAST  Result Date: 11/23/2021 CLINICAL DATA:  Abdomen pain, nausea and vomiting. EXAM: CT ABDOMEN WITHOUT CONTRAST TECHNIQUE: Multidetector CT imaging of the abdomen was performed following the standard protocol without IV contrast. RADIATION DOSE REDUCTION: This exam was performed according to the departmental dose-optimization program which includes automated exposure control, adjustment of the mA and/or kV according to patient size and/or use of iterative reconstruction technique. COMPARISON:  December 17, 2019 FINDINGS: Lower chest: Calcified granulomas are identified in the left lung base. The lungs are otherwise clear. The heart size is normal. Hepatobiliary: Several low-density lesions are identified in the liver suggesting metastasis. Gallbladder and biliary tree are normal. Pancreas: Unremarkable. No pancreatic ductal dilatation or surrounding inflammatory changes. Spleen: Normal in size without focal abnormality. Adrenals/Urinary Tract: Adrenal glands are unremarkable. Kidneys are normal, without renal calculi, focal lesion, or hydronephrosis. Stomach/Bowel: The stomach is within normal limits. The visualized bowel loops are normal. Extensive bowel content is identified in the colon. Vascular/Lymphatic: Aortic atherosclerosis. No enlarged abdominal lymph nodes. Other: None. Musculoskeletal: Degenerative joint changes of the lower lumbar spine are noted. IMPRESSION:  1. Several low-density lesions are identified in the liver suggesting metastasis. 2. Extensive bowel content is identified in the colon. This can be seen in constipation. Aortic Atherosclerosis (ICD10-I70.0). These results will be called to the ordering clinician or representative by the Radiologist Assistant, and communication documented in the PACS or  [facility] Dashboard. Electronically Signed   By: Abelardo Diesel M.D.   On: 11/23/2021 13:00   MR PELVIS W WO CONTRAST  Result Date: 12/06/2021 CLINICAL DATA:  Characterize incidental liver lesions seen by prior CT EXAM: MRI ABDOMEN AND PELVIS WITHOUT AND WITH CONTRAST TECHNIQUE: Multiplanar multisequence MR imaging of the abdomen and pelvis was performed both before and after the administration of intravenous contrast. CONTRAST:  35m GADAVIST GADOBUTROL 1 MMOL/ML IV SOLN COMPARISON:  CT abdomen, 11/23/2021 FINDINGS: COMBINED FINDINGS FOR BOTH MR ABDOMEN AND PELVIS Lower chest: No acute findings. Hepatobiliary: Multiple rim hypoenhancing lesions throughout the liver parenchyma, largest in the anterior left lobe of the liver, hepatic segment II/III measuring 5.2 x 3.9 cm (series 18, image 28), additional index lesion in the anterior liver dome, hepatic segment VIII measuring 4.4 x 4.3 cm (series 18, image 14). No gallstones. No biliary ductal dilatation. Pancreas: There is a hypoenhancing mass of the inferior pancreatic body measuring 3.1 x 1.7 cm, with prominence of the main pancreatic duct in the tail distal to the lesion (series 18, image 43). Spleen:  Within normal limits in size and appearance. Adrenals/Urinary Tract: Normal adrenal glands. No renal masses or suspicious contrast enhancement identified. No evidence of hydronephrosis. Stomach/Bowel: Large burden of stool throughout the colon and rectum. Vascular/Lymphatic: No pathologically enlarged lymph nodes identified. No abdominal aortic aneurysm demonstrated. The central splenic vein is effaced by mass, with  venous collateralization about the left upper quadrant. Reproductive: Status post hysterectomy. Other:  Trace free fluid in the low pelvis. Musculoskeletal: No suspicious osseous lesions identified. IMPRESSION: 1. Multiple rim hypoenhancing lesions throughout the liver parenchyma, consistent with metastases. 2. Hypoenhancing mass of the inferior pancreatic body, consistent with pancreatic adenocarcinoma. 3. No lymphadenopathy or other evidence of metastatic disease within the abdomen or pelvis. 4. The central splenic vein is effaced by pancreatic mass, with venous collateralization about the left upper quadrant. 5. Large burden of stool throughout the colon. Electronically Signed   By: ADelanna AhmadiM.D.   On: 12/06/2021 15:27   MR ABDOMEN MRCP W WO CONTAST  Result Date: 12/06/2021 CLINICAL DATA:  Characterize incidental liver lesions seen by prior CT EXAM: MRI ABDOMEN AND PELVIS WITHOUT AND WITH CONTRAST TECHNIQUE: Multiplanar multisequence MR imaging of the abdomen and pelvis was performed both before and after the administration of intravenous contrast. CONTRAST:  654mGADAVIST GADOBUTROL 1 MMOL/ML IV SOLN COMPARISON:  CT abdomen, 11/23/2021 FINDINGS: COMBINED FINDINGS FOR BOTH MR ABDOMEN AND PELVIS Lower chest: No acute findings. Hepatobiliary: Multiple rim hypoenhancing lesions throughout the liver parenchyma, largest in the anterior left lobe of the liver, hepatic segment II/III measuring 5.2 x 3.9 cm (series 18, image 28), additional index lesion in the anterior liver dome, hepatic segment VIII measuring 4.4 x 4.3 cm (series 18, image 14). No gallstones. No biliary ductal dilatation. Pancreas: There is a hypoenhancing mass of the inferior pancreatic body measuring 3.1 x 1.7 cm, with prominence of the main pancreatic duct in the tail distal to the lesion (series 18, image 43). Spleen:  Within normal limits in size and appearance. Adrenals/Urinary Tract: Normal adrenal glands. No renal masses or suspicious  contrast enhancement identified. No evidence of hydronephrosis. Stomach/Bowel: Large burden of stool throughout the colon and rectum. Vascular/Lymphatic: No pathologically enlarged lymph nodes identified. No abdominal aortic aneurysm demonstrated. The central splenic vein is effaced by mass, with venous collateralization about the left upper quadrant. Reproductive: Status post hysterectomy. Other:  Trace free fluid in the low pelvis. Musculoskeletal: No  suspicious osseous lesions identified. IMPRESSION: 1. Multiple rim hypoenhancing lesions throughout the liver parenchyma, consistent with metastases. 2. Hypoenhancing mass of the inferior pancreatic body, consistent with pancreatic adenocarcinoma. 3. No lymphadenopathy or other evidence of metastatic disease within the abdomen or pelvis. 4. The central splenic vein is effaced by pancreatic mass, with venous collateralization about the left upper quadrant. 5. Large burden of stool throughout the colon. Electronically Signed   By: Delanna Ahmadi M.D.   On: 12/06/2021 15:27    Assessment and plan- Patient is a 66 y.o. female with newly diagnosed pancreatic mass and liver metastases  I have reviewed MRI images independently and discussed findings with the patient and daughter.  Patient noted to have a pancreatic body mass and multiple lesions in the liver with the largest one measuring close to 5 cm concerning for liver metastases.Discussed that this is all highly concerning for stage IV pancreatic cancer given the liver metastases.  There would be no role for surgery or radiation therapy at this time.  For step would be to complete her staging work-up and I will get a CT chest without contrast.  I will also check a CMP PT/INR CEA and CA 19-9 today.  Her recent labs were not suggestive of obstructive jaundice.  I will also arrange for an ultrasound-guided liver biopsy to obtain tissue diagnosis.  Discussed that overall prognosis for stage IV pancreatic cancer is  poor and roughly about a year or so with treatment.  Without any systemic treatment overall prognosis is less than 6 months.  Modified FOLFIRINOX chemotherapy and gemcitabine Abraxane chemotherapy are both acceptable options.  Patient at baseline does not have any significant comorbidities and acceptable blood workAttempt modified FOLFIRINOX chemotherapy once a tissue diagnosis is made.  She will also need a port placement prior to chemotherapy.  Chemo will be given with a palliative intent.  Chemotherapy is typically given until progression or toxicity.  We will discuss specifics of chemotherapy after tissue diagnosis is obtained.  Patient's daughter is considering moving her parents with her to [place] and therefore patient may likely transfer care in the coming months.  No family history of any known cancers.  However she would benefit from genetic testing which we will arrange.  We will also send tissue for NGS testing after biopsy results are back.  Neoplasm related pain: I have prescribed her oxycodone 5 mg every 4 hours as needed.  She will be seen by NP Altha Harm from palliative care for a video visit later this week to adjust her pain medications as needed.  Opioid-induced constipation: Patient reports constipation even prior to starting opioids.  She is currently taking MiraLAX daily and have asked her to start taking senna 2 tablets daily as well in addition to MiraLAX.   Thank you for this kind referral and the opportunity to participate in the care of this patient   Visit Diagnosis 1. Liver metastases ([place])   2. Goals of care, counseling/discussion   3. Pancreatic mass   4. Neoplasm related pain     Dr. Randa Evens, MD, MPH [facility] [phone] 12/07/2021

## 2021-12-07 NOTE — Telephone Encounter (Signed)
Placed referral urgently has appointment with Dr. Janese Banks today.  ?

## 2021-12-07 NOTE — Telephone Encounter (Signed)
Called patient to discuss about MRI results that showed pancreatic lesion and liver lesions concerning for metastatic adenocarcinoma of the pancreas. Patient has appt with Dr Janese Banks today. Patient stated that she will be accompanied by her daughter and her husband to the oncology appt and she did not have any questions for me ? ?[name], MD ?

## 2021-12-07 NOTE — Progress Notes (Signed)
Introduced Therapist, nutritional and provide contact information for future needs. Liver biopsy being arranged. ?

## 2021-12-07 NOTE — Telephone Encounter (Signed)
-----   Message from [name], MD sent at 12/06/2021 10:02 PM EST ----- ?MRI findings concerning for metastatic pancreatic adenocarcinoma.  Please place urgent referral to oncology ? ?[name], MD ?

## 2021-12-08 ENCOUNTER — Other Ambulatory Visit: Payer: Self-pay | Admitting: Radiology

## 2021-12-08 LAB — CANCER ANTIGEN 19-9: CA 19-9: 2728 U/mL — ABNORMAL HIGH (ref 0–35)

## 2021-12-08 LAB — CEA: CEA: 3 ng/mL (ref 0.0–4.7)

## 2021-12-08 NOTE — Progress Notes (Signed)
Patient on schedule for Liver Biopsy 3/9, holding ASA 81 mg, after today,checking with Dr Denna Haggard who his MD/IR on 3/9, ok to proceed with biopsy 3/9. Called and spoke with patient with pre procedure instructions given. Made aware to be here @ 1000, NPO after [place] prior to procedure as well as driver post procedure/recovery/discharge. Stated understanding. ?

## 2021-12-09 ENCOUNTER — Encounter: Payer: Self-pay | Admitting: Oncology

## 2021-12-09 ENCOUNTER — Other Ambulatory Visit: Payer: Self-pay | Admitting: Radiology

## 2021-12-09 NOTE — Telephone Encounter (Signed)
We called IR to cancel the bx ?

## 2021-12-10 ENCOUNTER — Ambulatory Visit
Admission: RE | Admit: 2021-12-10 | Discharge: 2021-12-10 | Disposition: A | Discharge status: Home / Self Care | Payer: Medicare HMO | Source: Ambulatory Visit | Attending: Oncology | Admitting: Oncology

## 2021-12-11 ENCOUNTER — Telehealth: Payer: Self-pay

## 2021-12-11 ENCOUNTER — Ambulatory Visit: Payer: Medicare HMO

## 2021-12-11 ENCOUNTER — Inpatient Hospital Stay: Payer: Medicare HMO | Admitting: Hospice and Palliative Medicine

## 2021-12-11 NOTE — Telephone Encounter (Signed)
Spoke with Ms. Sokolow daughter. She is seeking second opinion at [facility] and needs all appointments cancelled at [facility]. Appointments cancelled. Notified Dr. Janese Banks, and Billey Chang, NP. Encouraged to call if anything needed in the future. ?

## 2021-12-12 ENCOUNTER — Encounter: Payer: Self-pay | Admitting: Gastroenterology

## 2021-12-15 MED ORDER — PANCRELIPASE (LIP-PROT-AMYL) 36000-114000 UNITS PO CPEP: ORAL_CAPSULE | ORAL | 3 refills | Status: AC

## 2021-12-16 NOTE — Telephone Encounter (Signed)
Canceled upcoming appointment  ?

## 2021-12-17 ENCOUNTER — Ambulatory Visit: Payer: Managed Care, Other (non HMO) | Admitting: Gastroenterology

## 2021-12-21 ENCOUNTER — Ambulatory Visit: Payer: Medicare HMO | Admitting: Oncology

## 2021-12-29 ENCOUNTER — Ambulatory Visit: Payer: Managed Care, Other (non HMO) | Admitting: Gastroenterology

## 2022-01-02 DEATH — deceased

## 2022-01-20 ENCOUNTER — Ambulatory Visit: Payer: Medicare HMO
